# Patient Record
Sex: Male | Born: 1975 | Race: Black or African American | Hispanic: No | Marital: Single | Smoking: Current every day smoker
Health system: Southern US, Community
[De-identification: ages and names within clinical notes are randomized; demographics above are authoritative.]

## PROBLEM LIST (undated history)

## (undated) DIAGNOSIS — R011 Cardiac murmur, unspecified: Secondary | ICD-10-CM

---

## 1999-07-20 ENCOUNTER — Encounter: Payer: Self-pay | Admitting: Emergency Medicine

## 1999-07-20 ENCOUNTER — Emergency Department (HOSPITAL_COMMUNITY): Admission: EM | Admit: 1999-07-20 | Discharge: 1999-07-20 | Payer: Self-pay | Admitting: Emergency Medicine

## 2003-01-18 ENCOUNTER — Emergency Department (HOSPITAL_COMMUNITY): Admission: AD | Admit: 2003-01-18 | Discharge: 2003-01-18 | Payer: Self-pay | Admitting: Family Medicine

## 2003-12-23 ENCOUNTER — Emergency Department (HOSPITAL_COMMUNITY): Admission: EM | Admit: 2003-12-23 | Discharge: 2003-12-23 | Payer: Self-pay | Admitting: Family Medicine

## 2004-06-07 ENCOUNTER — Emergency Department (HOSPITAL_COMMUNITY): Admission: EM | Admit: 2004-06-07 | Discharge: 2004-06-07 | Payer: Self-pay | Admitting: Emergency Medicine

## 2005-09-19 ENCOUNTER — Emergency Department (HOSPITAL_COMMUNITY): Admission: EM | Admit: 2005-09-19 | Discharge: 2005-09-19 | Payer: Self-pay | Admitting: Family Medicine

## 2009-09-23 ENCOUNTER — Emergency Department (HOSPITAL_COMMUNITY): Admission: EM | Admit: 2009-09-23 | Discharge: 2009-09-23 | Payer: Self-pay | Admitting: Emergency Medicine

## 2010-11-28 ENCOUNTER — Inpatient Hospital Stay (INDEPENDENT_AMBULATORY_CARE_PROVIDER_SITE_OTHER)
Admission: RE | Admit: 2010-11-28 | Discharge: 2010-11-28 | Disposition: A | Discharge status: Home / Self Care | Payer: Self-pay | Source: Ambulatory Visit | Attending: Family Medicine | Admitting: Family Medicine

## 2010-11-28 DIAGNOSIS — J029 Acute pharyngitis, unspecified: Secondary | ICD-10-CM

## 2010-11-28 DIAGNOSIS — J039 Acute tonsillitis, unspecified: Secondary | ICD-10-CM

## 2010-11-28 LAB — POCT RAPID STREP A: Streptococcus, Group A Screen (Direct): NEGATIVE

## 2012-12-12 ENCOUNTER — Encounter (HOSPITAL_COMMUNITY): Payer: Self-pay | Admitting: Cardiology

## 2012-12-12 ENCOUNTER — Emergency Department (HOSPITAL_COMMUNITY)
Admission: EM | Admit: 2012-12-12 | Discharge: 2012-12-12 | Disposition: A | Discharge status: Home / Self Care | Payer: Self-pay | Attending: Emergency Medicine | Admitting: Emergency Medicine

## 2012-12-12 DIAGNOSIS — S81812A Laceration without foreign body, left lower leg, initial encounter: Secondary | ICD-10-CM

## 2012-12-12 DIAGNOSIS — Y990 Civilian activity done for income or pay: Secondary | ICD-10-CM | POA: Insufficient documentation

## 2012-12-12 DIAGNOSIS — Y9389 Activity, other specified: Secondary | ICD-10-CM | POA: Insufficient documentation

## 2012-12-12 DIAGNOSIS — S81009A Unspecified open wound, unspecified knee, initial encounter: Secondary | ICD-10-CM | POA: Insufficient documentation

## 2012-12-12 DIAGNOSIS — W278XXA Contact with other nonpowered hand tool, initial encounter: Secondary | ICD-10-CM | POA: Insufficient documentation

## 2012-12-12 DIAGNOSIS — F17200 Nicotine dependence, unspecified, uncomplicated: Secondary | ICD-10-CM | POA: Insufficient documentation

## 2012-12-12 DIAGNOSIS — Y929 Unspecified place or not applicable: Secondary | ICD-10-CM | POA: Insufficient documentation

## 2012-12-12 NOTE — ED Provider Notes (Signed)
CSN: [id number]     Arrival date & time 12/12/12  1353 History   First MD Initiated Contact with Patient 12/12/12 1405     Chief Complaint  Patient presents with  . Extremity Laceration   (Consider location/radiation/quality/duration/timing/severity/associated sxs/prior Treatment) HPI Comments: 37 year old male presents to the emergency department with a laceration to his left lower leg that occurred about an hour and a half prior to arrival. Patient states he was using a chainsaw when it slipped and cut him in the leg. Pain currently 3/10. Denies numbness or tingling. Denies any leg pain, pain is all at the laceration site. Last tetanus shot 3 years ago.  The history is provided by the patient.    History reviewed. No pertinent past medical history. History reviewed. No pertinent past surgical history. History reviewed. No pertinent family history. History  Substance Use Topics  . Smoking status: Current Every Day Smoker    Types: Cigarettes  . Smokeless tobacco: Not on file  . Alcohol Use: Yes    Review of Systems  Skin: Positive for wound.  All other systems reviewed and are negative.    Allergies  Review of patient's allergies indicates no known allergies.  Home Medications  No current outpatient prescriptions on file. BP 153/78  Pulse 90  Temp(Src) 98.3 F (36.8 C) (Oral)  Resp 18  Ht 5\' 7"  (1.702 m)  Wt 163 lb (73.936 kg)  BMI 25.52 kg/m2  SpO2 99% Physical Exam  Nursing note and vitals reviewed. Constitutional: He is oriented to person, place, and time. He appears well-developed and well-nourished. No distress.  HENT:  Head: Normocephalic and atraumatic.  Mouth/Throat: Oropharynx is clear and moist.  Eyes: Conjunctivae are normal.  Neck: Normal range of motion. Neck supple.  Cardiovascular: Normal rate, regular rhythm and normal heart sounds.   Capillary refill less than 3 seconds.  Pulmonary/Chest: Effort normal and breath sounds normal.    Musculoskeletal: Normal range of motion. He exhibits no edema.       Legs: Left lower leg nontender, full flexion and extension of ankle and knee.  Neurological: He is alert and oriented to person, place, and time.  Sensation intact.  Skin: Skin is warm and dry. He is not diaphoretic.  Psychiatric: He has a normal mood and affect. His behavior is normal.    ED Course  Procedures (including critical care time) LACERATION REPAIR Performed by: Johnnette Gourd Authorized by: Johnnette Gourd Consent: Verbal consent obtained. Risks and benefits: risks, benefits and alternatives were discussed Consent given by: patient Patient identity confirmed: provided demographic data Prepped and Draped in normal sterile fashion Wound explored  Laceration Location: left lower leg  Laceration Length: 6 cm  No Foreign Bodies seen or palpated  Anesthesia: local infiltration  Local anesthetic: lidocaine 2% with epinephrine  Anesthetic total: 6 ml  Irrigation method: syringe Amount of cleaning: standard  Skin closure: 3-0 prolene  Number of sutures: 10  Technique: simple interrupted  Patient tolerance: Patient tolerated the procedure well with no immediate complications.  Labs Review Labs Reviewed - No data to display Imaging Review No results found.  MDM   1. Leg laceration, left, initial encounter    Patient with left leg laceration. Neurovascularly intact. Full range of motion of left lower extremity. Wound cleaned, 10 sutures placed. Return precautions discussed. Up-to-date on tetanus shot. Patient states understanding of plan and is agreeable.    Trevor Mace, PA-C 12/12/12 1510

## 2012-12-12 NOTE — ED Notes (Signed)
Pt reports that he was at work using a chainsaw and cut his left lower leg. Pt bleeding controlled. Pt reports laceration is about 4 inches. Unknown about last tetnus.

## 2012-12-13 NOTE — ED Provider Notes (Signed)
Medical screening examination/treatment/procedure(s) were performed by non-physician practitioner and as supervising physician I was immediately available for consultation/collaboration.  Melecio Cueto L Jamarquis Crull, MD 12/13/12 1000 

## 2012-12-28 ENCOUNTER — Encounter (HOSPITAL_COMMUNITY): Payer: Self-pay | Admitting: Emergency Medicine

## 2012-12-28 ENCOUNTER — Emergency Department (HOSPITAL_COMMUNITY)
Admission: EM | Admit: 2012-12-28 | Discharge: 2012-12-28 | Disposition: A | Discharge status: Home / Self Care | Payer: Self-pay | Attending: Emergency Medicine | Admitting: Emergency Medicine

## 2012-12-28 DIAGNOSIS — Z4802 Encounter for removal of sutures: Secondary | ICD-10-CM | POA: Insufficient documentation

## 2012-12-28 DIAGNOSIS — F17200 Nicotine dependence, unspecified, uncomplicated: Secondary | ICD-10-CM | POA: Insufficient documentation

## 2012-12-28 NOTE — ED Notes (Signed)
Pt here for suture removal on left lateral calf.

## 2012-12-28 NOTE — ED Provider Notes (Signed)
CSN: [id number]     Arrival date & time 12/28/12  0917 History  This chart was scribed for non-physician practitioner, Francee Piccolo PA-C working with Joya Gaskins, MD by Joaquin Music, ED Scribe. This patient was seen in room TR06C/TR06C and the patient's care was started at 9:26 AM   Chief Complaint  Patient presents with  . Suture / Staple Removal   HPI Comments: Patient is a 37 yo M presenting to the ED for suture removal. Patient was seen on 12/12/12 for a laceration to his left lateral calf from a chainsaw. Patient denies any pain or other concerning associated symptoms currently. Denies numbness or tingling in the extremity. Denies any fevers, chills, drainage from site, new injury. Last tetanus shot 3 years ago.   History reviewed. No pertinent past medical history. History reviewed. No pertinent past surgical history. History reviewed. No pertinent family history.  History  Substance Use Topics  . Smoking status: Current Every Day Smoker    Types: Cigarettes  . Smokeless tobacco: Not on file  . Alcohol Use: Yes    Review of Systems  Constitutional: Negative for fever and chills.  Musculoskeletal: Negative for myalgias, joint swelling and arthralgias.  Skin: Negative for color change, pallor and rash.    Allergies  Review of patient's allergies indicates no known allergies.  Home Medications   Current Outpatient Rx  Name  Route  Sig  Dispense  Refill  . Phenylephrine-APAP-Guaifenesin (MUCINEX FAST-MAX COLD & SINUS) 10-650-400 MG/20ML LIQD   Oral   Take by mouth as needed (cold).          Triage Vitals: BP 118/76  Pulse 89  Temp(Src) 97.3 F (36.3 C) (Oral)  Resp 20  SpO2 100%  Physical Exam  Constitutional: He is oriented to person, place, and time. He appears well-developed and well-nourished. No distress.  HENT:  Head: Normocephalic and atraumatic.  Right Ear: External ear normal.  Left Ear: External ear normal.  Nose: Nose  normal.  Eyes: Conjunctivae are normal.  Neck: Neck supple.  Cardiovascular: Intact distal pulses.   Pulmonary/Chest: Effort normal and breath sounds normal.  Musculoskeletal: Normal range of motion. He exhibits no edema and no tenderness.  Neurological: He is alert and oriented to person, place, and time.  Skin: Skin is warm and dry. He is not diaphoretic. No erythema.  Well healed incision on left lateral calf. No drainage. No surrounding erythema. Non-tender to palpation. No warmth.   Psychiatric: He has a normal mood and affect.    ED Course  Procedures  DIAGNOSTIC STUDIES: Oxygen Saturation is 100% on RA, normal by my interpretation.    COORDINATION OF CARE: 9:28 AM-Discussed treatment plan which includes suture removal. Pt agreed to plan.   Labs Review Labs Reviewed - No data to display Imaging Review No results found.  MDM   1. Visit for suture removal     Afebrile, NAD, non-toxic appearing, AAOx4. Neurovascularly intact. Well healed scar. No concern for infection. Pt to ER for staple/suture removal and wound check as above. Procedure tolerated well. Vitals normal, no signs of infection. Scar minimization & return precautions given at dc. Return precautions discussed. Patient is agreeable to plan. Patient is stable at time of discharge      I personally performed the services described in this documentation, which was scribed in my presence. The recorded information has been reviewed and is accurate.     Jeannetta Ellis, PA-C 12/28/12 1643

## 2012-12-30 NOTE — ED Provider Notes (Signed)
Medical screening examination/treatment/procedure(s) were performed by non-physician practitioner and as supervising physician I was immediately available for consultation/collaboration.   Joya Gaskins, MD 12/30/12 2249

## 2014-02-28 ENCOUNTER — Encounter (HOSPITAL_COMMUNITY): Payer: Self-pay | Admitting: Emergency Medicine

## 2014-02-28 ENCOUNTER — Emergency Department (HOSPITAL_COMMUNITY): Payer: Self-pay

## 2014-02-28 ENCOUNTER — Emergency Department (HOSPITAL_COMMUNITY)
Admission: EM | Admit: 2014-02-28 | Discharge: 2014-02-28 | Disposition: A | Discharge status: Home / Self Care | Payer: Self-pay | Attending: Emergency Medicine | Admitting: Emergency Medicine

## 2014-02-28 ENCOUNTER — Other Ambulatory Visit: Payer: Self-pay

## 2014-02-28 DIAGNOSIS — R0789 Other chest pain: Secondary | ICD-10-CM | POA: Insufficient documentation

## 2014-02-28 DIAGNOSIS — R079 Chest pain, unspecified: Secondary | ICD-10-CM

## 2014-02-28 DIAGNOSIS — Z720 Tobacco use: Secondary | ICD-10-CM | POA: Insufficient documentation

## 2014-02-28 LAB — CBC
HCT: 45 % (ref 39.0–52.0)
HEMOGLOBIN: 15.6 g/dL (ref 13.0–17.0)
MCH: 33.5 pg (ref 26.0–34.0)
MCHC: 34.7 g/dL (ref 30.0–36.0)
MCV: 96.8 fL (ref 78.0–100.0)
PLATELETS: 243 10*3/uL (ref 150–400)
RBC: 4.65 MIL/uL (ref 4.22–5.81)
RDW: 12.2 % (ref 11.5–15.5)
WBC: 13.2 10*3/uL — ABNORMAL HIGH (ref 4.0–10.5)

## 2014-02-28 LAB — BASIC METABOLIC PANEL
ANION GAP: 16 — AB (ref 5–15)
BUN: 11 mg/dL (ref 6–23)
CO2: 22 mEq/L (ref 19–32)
Calcium: 9.6 mg/dL (ref 8.4–10.5)
Chloride: 101 mEq/L (ref 96–112)
Creatinine, Ser: 0.95 mg/dL (ref 0.50–1.35)
GFR calc Af Amer: >90 mL/min (ref >90)
GLUCOSE: 113 mg/dL — AB (ref 70–99)
Potassium: 4.5 mEq/L (ref 3.7–5.3)
SODIUM: 139 meq/L (ref 137–147)

## 2014-02-28 LAB — I-STAT TROPONIN, ED
TROPONIN I, POC: 0 ng/mL (ref 0.00–0.08)
TROPONIN I, POC: 0 ng/mL (ref 0.00–0.08)

## 2014-02-28 NOTE — ED Notes (Signed)
Report to becky, rn.  Pt care transferred 

## 2014-02-28 NOTE — ED Notes (Signed)
Per Dr Rhunette CroftNanavati, order 2nd trop to be drawn 3 hours after 1st - 1900.

## 2014-02-28 NOTE — ED Provider Notes (Signed)
CSN: [id number]     Arrival date & time 02/28/14  1553 History   First MD Initiated Contact with Patient 02/28/14 1622     Chief Complaint  Patient presents with  . Chest Pain     (Consider location/radiation/quality/duration/timing/severity/associated sxs/prior Treatment) HPI Comments: Pt comes in with cc of chest pain. Pt has no medical hx. He is a heavy smoker, and his father dies of a MI in his 5440s. Pt reports that he has been having : sided chest pain, intermittent, for the last several weeks. For the last few days though, the pain is more constant. Chest pain is sharp, intermittent, focal, non radiating and lasts just for few seconds at a time. Pt has no specific aggravating or relieving factors. There is no associated nausea, dib, diophoresis. Pain is not exertional.  Patient is a 38 y.o. male presenting with chest pain. The history is provided by the patient.  Chest Pain Associated symptoms: no abdominal pain, no cough and no shortness of breath     History reviewed. No pertinent past medical history. History reviewed. No pertinent past surgical history. No family history on file. History  Substance Use Topics  . Smoking status: Current Every Day Smoker -- 1.00 packs/day    Types: Cigarettes  . Smokeless tobacco: Not on file  . Alcohol Use: Yes     Comment: heavy etoh every weekend x 2 years    Review of Systems  Constitutional: Negative for activity change and appetite change.  Respiratory: Negative for cough and shortness of breath.   Cardiovascular: Positive for chest pain.  Gastrointestinal: Negative for abdominal pain.  Genitourinary: Negative for dysuria.      Allergies  Review of patient's allergies indicates no known allergies.  Home Medications   Prior to Admission medications   Not on File   BP 117/90 mmHg  Pulse 60  Temp(Src) 98 F (36.7 C) (Oral)  Resp 17  Ht 5\' 7"  (1.702 m)  Wt 170 lb (77.111 kg)  BMI 26.62 kg/m2  SpO2 99% Physical Exam   Constitutional: He is oriented to person, place, and time. He appears well-developed.  HENT:  Head: Normocephalic and atraumatic.  Eyes: Conjunctivae and EOM are normal. Pupils are equal, round, and reactive to light.  Neck: Normal range of motion. Neck supple.  Cardiovascular: Normal rate and regular rhythm.   Pulmonary/Chest: Effort normal and breath sounds normal.  Abdominal: Soft. Bowel sounds are normal. He exhibits no distension. There is no tenderness. There is no rebound and no guarding.  Neurological: He is alert and oriented to person, place, and time.  Skin: Skin is warm.  Vitals reviewed.   ED Course  Procedures (including critical care time) Labs Review Labs Reviewed  CBC - Abnormal; Notable for the following:    WBC 13.2 (*)    All other components within normal limits  BASIC METABOLIC PANEL - Abnormal; Notable for the following:    Glucose, Bld 113 (*)    Anion gap 16 (*)    All other components within normal limits  I-STAT TROPOININ, ED    Imaging Review Dg Chest 2 View  02/28/2014   CLINICAL DATA:  Chest pain.  EXAM: CHEST  2 VIEW  COMPARISON:  None.  FINDINGS: The heart size and mediastinal contours are within normal limits. Both lungs are clear. No pneumothorax or pleural effusion is noted. The visualized skeletal structures are unremarkable.  IMPRESSION: No acute cardiopulmonary abnormality seen.   Electronically Signed   By: Fayrene FearingJames  Green M.D.   On: 02/28/2014 17:07     EKG Interpretation None      MDM   Final diagnoses:  Chest pain    Differential diagnosis includes: ACS syndrome CHF exacerbation Valvular disorder Myocarditis Pericarditis Pericardial effusion Pneumonia Pleural effusion Pulmonary edema PE Anemia Musculoskeletal pain  Pt comes in with cc of chest pain. Chest pain is atypical. HEART score is 1 - for the risk factors. Pt will get serial troponin.  Smoking cessation instruction/counseling given:  counseled patient on the  dangers of tobacco use, advised patient to stop smoking, and reviewed strategies to maximize success. Discussion for 3- 5 min.  7:39 PM Normal delta trops, will d.c.  Derwood KaplanAnkit Calton Harshfield, MD 02/28/14 [phone]

## 2014-02-28 NOTE — Discharge Instructions (Signed)
We saw you in the ER for the chest pain/shortness of breath. All of our cardiac workup is normal, including labs, EKG and chest X-RAY are normal. We are not sure what is causing your discomfort, but we feel comfortable sending you home at this time. The workup in the ER is not complete, and you should follow up with your primary care doctor for further evaluation.  PLEASE TAKE BABY ASPIRIN, OVER THE COUNTER DAILY. REFRAIN FROM SMOKING, SEE THE DOCTORS AT [facility] - OR USE THE RESOURCES PROVIDED.  RESOURCE GUIDE  Chronic Pain Problems: Contact [facility]  [phone] Patients need to be referred by their primary care doctor.  Insufficient Money for Medicine: Contact [employer]:  call "211."   No Primary Care Doctor: - Call Health Connect  [phone] - can help you locate a primary care doctor that  accepts your insurance, provides certain services, etc. - Physician Referral Service- [phone]  Agencies that provide inexpensive medical care: - [facility]  [phone] - [facility]  [phone] - [facility]  [phone] - Women's Clinic  [phone] - Planned Parenthood  [phone]- [facility]  [phone]  Medicaid-accepting [facility] Providers: - [facility]- [address], [address]  [phone], Mon-Fri 9am-7pm, Sat 9am-1pm - [facility]- [address], [address] [place]  [phone] - [facility]- [address], [address] [place]  [phone] [facility]- [address] [place] [address]  [phone] - Renaye RakersVeita Bland- [address], [address], [phone]  Only accepts [place] patients after they have their name  applied to their card  Self Pay (no insurance) in [place]: - Sickle Cell Patients: Dr Willey BladeEric Dean, [facility]  [address], [phone] - [facility]- [address]  [phone]        [facility]ge Gainer-     [facility]- [address], [address]       -     [facility]- see information above (Speak to CitigroupPam H if you do not have insurance)       -  [facility] [place]- [address],  [phone]       -  [facility]- [address] [place] [address], [phone]       -  Dr Julio Sickssei-Bonsu-  [address], [address], [place], [phone]       -  [facility] - [address], [phone]       -  [facility] [place]- [address] [place] [address], [phone], also [address], [phone]       -    [facility]- [address], [phone], 1st & 3rd Saturday        every month, 10am-1pm  [facility] [address] [place], [place] [address] [phone]  [facility] [address] [place], [place] [address] [phone]  1) Find a Doctor and Pay Out of Pocket Although you won't have to find out who is covered by your insurance plan, it is a good idea to ask around and get recommendations. You will then need to call the office and see if the doctor you have chosen will accept you as a new patient and what types of options they offer for patients who are  self-pay. Some doctors offer discounts or will set up payment plans for their patients who do not have insurance, but you will need to ask so you aren't surprised when you get to your appointment.  2) Contact Your Local Health Department Not all health departments have doctors that can see patients for sick visits, but many do, so it is worth a call to see if yours does. If you don't know where your local health department is, you can check in your phone book. The CDC also has a tool to help you locate your state's health department, and many state websites also have listings of all of their local health departments.  3) Find a Walk-in Clinic If your illness is not likely to be very severe or complicated, you may want to try a walk in  clinic. These are popping up all over the country in pharmacies, drugstores, and shopping centers. They're usually staffed by nurse practitioners or physician assistants that have been trained to treat common illnesses and complaints. They're usually fairly quick and inexpensive. However, if you have serious medical issues or chronic medical problems, these are probably not your best option  STD Testing - [facility], STD Clinic, [address], [place], phone [phone] or [phone].  Monday - Friday, call for an appointment. [facility]- [place] [facility] of [facility] [place], STD Clinic, [place], [place], phone [phone] or [phone].  Monday - Friday, call for an appointment.  Abuse/Neglect: [facility]- [place] Child Abuse Hotline [phone] [facility]- [place] Child Abuse Hotline [phone] (After Hours)  Emergency Shelter:  [facility] [phone]  Maternity Homes: - [facility] [phone] - [facility] [phone]  MRSA Hotline #:   (832) 198-8587(364)799-8617  Dental Assistance If unable to pay or uninsured, contact:  [facility] to become qualified for the adult dental clinic.  Patients with Medicaid: [facility] [phone], [phone] [address], [phone]  If unable to pay, or uninsured, contact [facility] [phone] in [place], [phone] in [facility]) to become qualified for the adult dental clinic  [facility] [address] [place], [place] [address] [phone] [email or web address]  Other [facility] Community Dental Services: - [facility]- [address], [place], [place], [phone], [phone], Ext. [phone], 2nd and 4th Thursday of the month at 6:30am.  10 clients each day by appointment, can sometimes see walk-in patients if someone does not show for an  appointment. [facility]- [address], [place], [place], [phone], [phone] - [facility]- [address], [place], [place], [phone], [phone] - [facility]- [phone] [facility]- [place]- [phone] [facility]- [place] [facility]- [phone]

## 2014-02-28 NOTE — ED Notes (Signed)
C/o intermittent sharp L sided chest pain x 2-3 days.  States pain becoming more constant since yesterday.  Denies sob, nausea, and vomiting.  Also reports dizziness and feeling anxious.

## 2015-03-13 ENCOUNTER — Encounter (HOSPITAL_COMMUNITY): Payer: Self-pay | Admitting: Emergency Medicine

## 2015-03-13 ENCOUNTER — Emergency Department (HOSPITAL_COMMUNITY)
Admission: EM | Admit: 2015-03-13 | Discharge: 2015-03-13 | Disposition: A | Discharge status: Home / Self Care | Payer: Self-pay | Attending: Emergency Medicine | Admitting: Emergency Medicine

## 2015-03-13 DIAGNOSIS — F17210 Nicotine dependence, cigarettes, uncomplicated: Secondary | ICD-10-CM | POA: Insufficient documentation

## 2015-03-13 DIAGNOSIS — K029 Dental caries, unspecified: Secondary | ICD-10-CM | POA: Insufficient documentation

## 2015-03-13 MED ORDER — TRAMADOL HCL 50 MG PO TABS: 50.0000 mg | ORAL_TABLET | Freq: Four times a day (QID) | ORAL | Status: DC | PRN

## 2015-03-13 MED ORDER — AMOXICILLIN 500 MG PO CAPS: 500.0000 mg | ORAL_CAPSULE | Freq: Three times a day (TID) | ORAL | Status: DC

## 2015-03-13 NOTE — ED Notes (Signed)
Pt is in stable condition upon d/c and ambulates from ED. 

## 2015-03-13 NOTE — Discharge Instructions (Signed)
For pain control you may take:  800mg  of ibuprofen (that is usually 4 over the counter pills)  3 times a day (take with food) and acetaminophen 975mg  (this is 3 over the counter pills) four times a day. Do not drink alcohol or combine with other medications that have acetaminophen as an ingredient (Read the labels!).  For breakthrough pain you may take Tramadol. Do not drink alcohol drive or operate heavy machinery when taking Tramadol.  Return to the emergency room for fever, change in vision, redness to the face that rapidly spreads towards the eye, nausea or vomiting, difficulty swallowing or shortness of breath.   Apply warm compresses to jaw throughout the day.   Take your antibiotics as directed and to the end of the course.   Followup with a dentist is very important for ongoing evaluation and management of recurrent dental pain. Return to emergency department for emergent changing or worsening symptoms."    Dental Caries Dental caries is tooth decay. This decay can cause a hole in teeth (cavity) that can get bigger and deeper over time. HOME CARE  Brush and floss your teeth. Do this at least two times a day.  Use a fluoride toothpaste.  Use a mouth rinse if told by your dentist or doctor.  Eat less sugary and starchy foods. Drink less sugary drinks.  Avoid snacking often on sugary and starchy foods. Avoid sipping often on sugary drinks.  Keep regular checkups and cleanings with your dentist.  Use fluoride supplements if told by your dentist or doctor.  Allow fluoride to be applied to teeth if told by your dentist or doctor.   This information is not intended to replace advice given to you by your health care provider. Make sure you discuss any questions you have with your health care provider.   Document Released: 12/27/2007 Document Revised: 04/09/2014 Document Reviewed: 03/21/2012 Elsevier Interactive Patient Education 2016 [employer]  Low-cost dental  clinic: Gregory Christian**Gregory  Christian  at [phone] Christian**   You may also call [phone]  Dental Assistance If the dentist on-call cannot see you, please use the resources below:   Patients with Medicaid: [facility] [phone], [phone] [address], [phone]  If unable to pay, or uninsured, contact [facility] [phone]) or [facility] [phone] in [place], [phone] in [facility] [place]) to become qualified for the adult dental clinic  Other Low-Cost Community Dental Services: [facility]- [address], [place], [place], [address]    [phone], Ext. [phone]    2nd and 4th Thursday of the month at 6:30am    10 clients each day by appointment, can sometimes see walk-in     patients if someone does not show for an appointment [facility]- [address], [place], [place], [id number]    [phone] [facility]- [address], [place], [place], [address]    [phone]  [facility]- [phone] [facility]- [phone] [facility]- [phone]

## 2015-03-13 NOTE — ED Provider Notes (Signed)
CSN: [id number]     Arrival date & time 03/13/15  4098 History  By signing my name below, I, Elon Spanner, attest that this documentation has been prepared under the direction and in the presence of [employer], PA-C. Electronically Signed: Elon Spanner ED Scribe. 03/13/2015. 10:38 AM.    Chief Complaint  Patient presents with  . Dental Pain   HPI HPI Comments: Gregory Christian is a 39 y.o. male who presents to the Emergency Department complaining of constant, 12/10 left upper dental pain onset 3 days ago; unrelieved by Percocet  (2 days ago), tramadol, Orajel, and ibuprofen.  The patient reports he broke the affected tooth 1 year ago and has had 1-2 several day of episodes of pain since with complete resolution between episodes.  The pain is relieved by exposure to cold water.  The patient is not followed and has not been seen by a dentist. Patient rates his pain at 10 out of 10, no exacerbating or alleviating factors identified.   History reviewed. No pertinent past medical history. History reviewed. No pertinent past surgical history. No family history on file. Social History  Substance Use Topics  . Smoking status: Current Every Day Smoker -- 1.00 packs/day    Types: Cigarettes  . Smokeless tobacco: None  . Alcohol Use: Yes     Comment: heavy etoh every weekend x 2 years    Review of Systems .A complete 10 system review of systems was obtained and all systems are negative except as noted in the HPI and PMH.   Allergies  Review of patient's allergies indicates no known allergies.  Home Medications   Prior to Admission medications   Not on File   BP 110/71 mmHg  Pulse 62  Temp(Src) 98 F (36.7 C) (Oral)  Resp 20  SpO2 99% Physical Exam  Constitutional: He is oriented to person, place, and time. He appears well-developed and well-nourished. No distress.  HENT:  Head: Normocephalic and atraumatic.  Mouth/Throat: Oropharynx is clear and moist.  Generally poor  dentition, no gingival swelling, erythema or tenderness to palpation. Patient is handling their secretions. There is no tenderness to palpation or firmness underneath tongue bilaterally. No trismus.    Eyes: Conjunctivae and EOM are normal. Pupils are equal, round, and reactive to light.  Neck: Normal range of motion. Neck supple. No tracheal deviation present.     Cardiovascular: Normal rate, regular rhythm and intact distal pulses.   Pulmonary/Chest: Effort normal and breath sounds normal. No stridor. No respiratory distress. He has no wheezes. He has no rales. He exhibits no tenderness.  Abdominal: Soft. Bowel sounds are normal. He exhibits no distension and no mass. There is no tenderness. There is no rebound and no guarding.  Musculoskeletal: Normal range of motion. He exhibits no edema or tenderness.  Lymphadenopathy:    He has no cervical adenopathy.  Neurological: He is alert and oriented to person, place, and time.  Skin: Skin is warm and dry.  Psychiatric: He has a normal mood and affect. His behavior is normal.  Nursing note and vitals reviewed.   ED Course  .Nerve Block Date/Time: 03/13/2015 3:08 PM Performed by: Wynetta Emery Authorized by: Wynetta Emery Consent: Verbal consent obtained. Consent given by: patient Required items: required blood products, implants, devices, and special equipment available Patient identity confirmed: verbally with patient Indications: pain relief Body area: face/mouth Nerve: posterior superior alveolar Laterality: left Patient sedated: no Preparation: Patient was prepped and draped in the usual sterile fashion. Patient position:  sitting Needle gauge: 27 G Location technique: anatomical landmarks Local anesthetic: bupivacaine 0.5% with epinephrine Anesthetic total: 1.8 ml Outcome: pain improved Patient tolerance: Patient tolerated the procedure well with no immediate complications    .Nerve Block Date/Time: 03/13/2015 3:08  PM Performed by: Wynetta EmeryPISCIOTTA, Nettie Cromwell Authorized by: Wynetta EmeryPISCIOTTA, Evelin Cake Consent: Verbal consent obtained. Consent given by: patient Required items: required blood products, implants, devices, and special equipment available Patient identity confirmed: verbally with patient Indications: pain relief Body area: face/mouth Nerve: inferior alveolar Laterality: left Patient sedated: no Preparation: Patient was prepped and draped in the usual sterile fashion. Patient position: sitting Needle gauge: 27 G Location technique: anatomical landmarks Local anesthetic: bupivacaine 0.5% with epinephrine Anesthetic total: 1.8 ml Outcome: pain improved Patient tolerance: Patient tolerated the procedure well with no immediate complications   DIAGNOSTIC STUDIES: Oxygen Saturation is 99% on RA, normal by my interpretation.    COORDINATION OF CARE:  10:37 AM Will perform dental block and antibiotic.  Patient was offered pain medication and specifically requested tramadol.  Patient advised to f/u with dentist.  Patient acknowledges and agrees with plan.    Labs Review Labs Reviewed - No data to display  Imaging Review No results found. I have personally reviewed and evaluated these images and lab results as part of my medical decision-making.   EKG Interpretation None      MDM   Final diagnoses:  Pain due to dental caries    Filed Vitals:   03/13/15 0920  BP: 110/71  Pulse: 62  Temp: 98 F (36.7 C)  TempSrc: Oral  Resp: 20  SpO2: 99%    Gregory Christian is 39 y.o. male presenting with dental pain associated with dental caries but no signs or symptoms of dental abscess. Patient afebrile, non toxic appearing and swallowing secretions well. I gave patient referral to dentist and stressed the importance of dental follow up for definitive management of dental issues. Patient voices understanding and is agreeable to plan.  Evaluation does not show pathology that would require ongoing  emergent intervention or inpatient treatment. Pt is hemodynamically stable and mentating appropriately. Discussed findings and plan with patient/guardian, who agrees with care plan. All questions answered. Return precautions discussed and outpatient follow up given.   Discharge Medication List as of 03/13/2015 10:44 AM    START taking these medications   Details  amoxicillin (AMOXIL) 500 MG capsule Take 1 capsule (500 mg total) by mouth 3 (three) times daily., Starting 03/13/2015, Until Discontinued, Print    traMADol (ULTRAM) 50 MG tablet Take 1 tablet (50 mg total) by mouth every 6 (six) hours as needed., Starting 03/13/2015, Until Discontinued, Print        I personally performed the services described in this documentation, which was scribed in my presence. The recorded information has been reviewed and is accurate.    Wynetta Emeryicole Marri Mcneff, PA-C 03/13/15 1510  Cathren LaineKevin Steinl, MD 03/13/15 [id number]

## 2015-03-13 NOTE — ED Notes (Signed)
Pt c/o left upper and lower toothache onset Thursday.

## 2015-11-17 ENCOUNTER — Emergency Department (HOSPITAL_COMMUNITY): Payer: Self-pay

## 2015-11-17 ENCOUNTER — Emergency Department (HOSPITAL_COMMUNITY)
Admission: EM | Admit: 2015-11-17 | Discharge: 2015-11-17 | Disposition: A | Discharge status: Home / Self Care | Payer: Self-pay | Attending: Emergency Medicine | Admitting: Emergency Medicine

## 2015-11-17 ENCOUNTER — Encounter (HOSPITAL_COMMUNITY): Payer: Self-pay | Admitting: Emergency Medicine

## 2015-11-17 DIAGNOSIS — S161XXA Strain of muscle, fascia and tendon at neck level, initial encounter: Secondary | ICD-10-CM | POA: Insufficient documentation

## 2015-11-17 DIAGNOSIS — S20219A Contusion of unspecified front wall of thorax, initial encounter: Secondary | ICD-10-CM | POA: Insufficient documentation

## 2015-11-17 DIAGNOSIS — Y939 Activity, unspecified: Secondary | ICD-10-CM | POA: Insufficient documentation

## 2015-11-17 DIAGNOSIS — Y999 Unspecified external cause status: Secondary | ICD-10-CM | POA: Insufficient documentation

## 2015-11-17 DIAGNOSIS — Y92410 Unspecified street and highway as the place of occurrence of the external cause: Secondary | ICD-10-CM | POA: Insufficient documentation

## 2015-11-17 DIAGNOSIS — F17210 Nicotine dependence, cigarettes, uncomplicated: Secondary | ICD-10-CM | POA: Insufficient documentation

## 2015-11-17 MED ORDER — NAPROXEN 500 MG PO TABS: 500.0000 mg | ORAL_TABLET | Freq: Two times a day (BID) | ORAL | 0 refills | Status: DC

## 2015-11-17 MED ORDER — ORPHENADRINE CITRATE ER 100 MG PO TB12
100.0000 mg | ORAL_TABLET | Freq: Two times a day (BID) | ORAL | 0 refills | Status: DC | PRN
Start: 2015-11-17 — End: 2019-04-29

## 2015-11-17 MED ORDER — NAPROXEN 250 MG PO TABS
500.0000 mg | ORAL_TABLET | Freq: Once | ORAL | Status: AC
  Administered 2015-11-17: 500 mg via ORAL
  Filled 2015-11-17: qty 2
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
## 2015-11-17 NOTE — ED Notes (Signed)
Pt stable, ambulatory, states understanding of discharge instructions 

## 2015-11-17 NOTE — ED Notes (Signed)
Pt ambulated to waiting lobby, family left at bedside assured that he was coming back
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
## 2015-11-17 NOTE — ED Notes (Signed)
Respiratory to bring spirometer
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
## 2018-09-21 ENCOUNTER — Emergency Department (HOSPITAL_COMMUNITY)
Admission: EM | Admit: 2018-09-21 | Discharge: 2018-09-21 | Disposition: A | Discharge status: Home / Self Care | Payer: Self-pay | Attending: Emergency Medicine | Admitting: Emergency Medicine

## 2018-09-21 ENCOUNTER — Other Ambulatory Visit: Payer: Self-pay

## 2018-09-21 ENCOUNTER — Encounter (HOSPITAL_COMMUNITY): Payer: Self-pay | Admitting: Emergency Medicine

## 2018-09-21 DIAGNOSIS — R112 Nausea with vomiting, unspecified: Secondary | ICD-10-CM | POA: Insufficient documentation

## 2018-09-21 DIAGNOSIS — F17210 Nicotine dependence, cigarettes, uncomplicated: Secondary | ICD-10-CM | POA: Insufficient documentation

## 2018-09-21 DIAGNOSIS — R1013 Epigastric pain: Secondary | ICD-10-CM | POA: Insufficient documentation

## 2018-09-21 LAB — LIPASE, BLOOD: Lipase: 27 U/L (ref 11–51)

## 2018-09-21 LAB — COMPREHENSIVE METABOLIC PANEL
ALT: 16 U/L (ref 0–44)
AST: 23 U/L (ref 15–41)
Albumin: 4.6 g/dL (ref 3.5–5.0)
Alkaline Phosphatase: 67 U/L (ref 38–126)
Anion gap: 14 (ref 5–15)
BUN: 13 mg/dL (ref 6–20)
CO2: 21 mmol/L — ABNORMAL LOW (ref 22–32)
Calcium: 9.4 mg/dL (ref 8.9–10.3)
Chloride: 105 mmol/L (ref 98–111)
Creatinine, Ser: 0.82 mg/dL (ref 0.61–1.24)
GFR calc Af Amer: >60 mL/min (ref >60)
GFR calc non Af Amer: >60 mL/min (ref >60)
Glucose, Bld: 90 mg/dL (ref 70–99)
Potassium: 3.7 mmol/L (ref 3.5–5.1)
Sodium: 140 mmol/L (ref 135–145)
Total Bilirubin: 1 mg/dL (ref 0.3–1.2)
Total Protein: 8 g/dL (ref 6.5–8.1)

## 2018-09-21 LAB — CBC
HCT: 47.1 % (ref 39.0–52.0)
Hemoglobin: 16 g/dL (ref 13.0–17.0)
MCH: 33.8 pg (ref 26.0–34.0)
MCHC: 34 g/dL (ref 30.0–36.0)
MCV: 99.6 fL (ref 80.0–100.0)
Platelets: 293 10*3/uL (ref 150–400)
RBC: 4.73 MIL/uL (ref 4.22–5.81)
RDW: 12 % (ref 11.5–15.5)
WBC: 16.4 10*3/uL — ABNORMAL HIGH (ref 4.0–10.5)
nRBC: 0 % (ref 0.0–0.2)

## 2018-09-21 LAB — CBG MONITORING, ED: Glucose-Capillary: 116 mg/dL — ABNORMAL HIGH (ref 70–99)

## 2018-09-21 MED ORDER — FAMOTIDINE IN NACL 20-0.9 MG/50ML-% IV SOLN
20.0000 mg | Freq: Once | INTRAVENOUS | Status: AC
  Administered 2018-09-21: 20 mg via INTRAVENOUS
  Filled 2018-09-21: qty 50

## 2018-09-21 MED ORDER — SODIUM CHLORIDE 0.9 % IV BOLUS
1000.0000 mL | Freq: Once | INTRAVENOUS | Status: AC
  Administered 2018-09-21: 1000 mL via INTRAVENOUS

## 2018-09-21 MED ORDER — FAMOTIDINE 20 MG PO TABS: 20.0000 mg | ORAL_TABLET | Freq: Every day | ORAL | 0 refills | Status: AC
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
## 2018-09-21 NOTE — ED Provider Notes (Signed)
[facility] DEPT Provider Note   CSN: [id number] Arrival date & time: 09/21/18  1603     History   Chief Complaint Chief Complaint  Patient presents with  . Abdominal Pain  . Emesis    HPI Gregory Christian is a 43 y.o. male.  He has no significant medical history.  He is complaining acute onset of diffuse abdominal pain and 4 episodes of vomiting since 1 AM this morning after drinking alcohol.  He said his last vomit was at 10 AM this morning.  There is no blood in the vomitus.  No fevers or chills no diarrhea.  No sick contacts or recent travel.  He is tried nothing for this.     The history is provided by the patient.  Abdominal Pain Pain location:  Generalized Pain quality: burning and stabbing   Pain radiates to:  Does not radiate Pain severity:  Moderate Onset quality:  Sudden Timing:  Constant Progression:  Unchanged Chronicity:  New Context: alcohol use and retching   Context: not trauma   Relieved by:  Nothing Worsened by:  Vomiting Ineffective treatments:  None tried Associated symptoms: nausea and vomiting   Associated symptoms: no chest pain, no constipation, no cough, no diarrhea, no dysuria, no fever, no hematemesis, no hematochezia, no hematuria, no melena, no shortness of breath and no sore throat   Emesis Associated symptoms: abdominal pain   Associated symptoms: no cough, no diarrhea, no fever, no headaches and no sore throat     History reviewed. No pertinent past medical history.  There are no active problems to display for this patient.   History reviewed. No pertinent surgical history.      Home Medications    Prior to Admission medications   Medication Sig Start Date End Date Taking? Authorizing Provider  amoxicillin (AMOXIL) 500 MG capsule Take 1 capsule (500 mg total) by mouth 3 (three) times daily. 03/13/15   Pisciotta, Elmyra Ricks, PA-C  naproxen (NAPROSYN) 500 MG tablet Take 1 tablet (500 mg total) by mouth 2  (two) times daily. 11/17/15   Law, Bea Graff, PA-C  orphenadrine (NORFLEX) 100 MG tablet Take 1 tablet (100 mg total) by mouth 2 (two) times daily as needed for muscle spasms. 11/17/15   Law, Bea Graff, PA-C  traMADol (ULTRAM) 50 MG tablet Take 1 tablet (50 mg total) by mouth every 6 (six) hours as needed. 03/13/15   Pisciotta, Charna Elizabeth    Family History No family history on file.  Social History Social History   Tobacco Use  . Smoking status: Current Every Day Smoker    Packs/day: 1.00    Types: Cigarettes  . Smokeless tobacco: Never Used  Substance Use Topics  . Alcohol use: Yes    Comment: heavy etoh every weekend x 2 years  . Drug use: No     Allergies   Patient has no known allergies.   Review of Systems Review of Systems  Constitutional: Negative for fever.  HENT: Negative for sore throat.   Eyes: Negative for visual disturbance.  Respiratory: Negative for cough and shortness of breath.   Cardiovascular: Negative for chest pain.  Gastrointestinal: Positive for abdominal pain, nausea and vomiting. Negative for constipation, diarrhea, hematemesis, hematochezia and melena.  Genitourinary: Negative for dysuria and hematuria.  Musculoskeletal: Negative for neck pain.  Skin: Negative for rash.  Neurological: Negative for headaches.     Physical Exam Updated Vital Signs BP 133/79 (BP Location: Right Arm)   Pulse 68  Temp 98.5 F (36.9 C) (Oral)   Resp 17   SpO2 100%   Physical Exam Vitals signs and nursing note reviewed.  Constitutional:      Appearance: He is well-developed.  HENT:     Head: Normocephalic and atraumatic.  Eyes:     Conjunctiva/sclera: Conjunctivae normal.  Neck:     Musculoskeletal: Neck supple.  Cardiovascular:     Rate and Rhythm: Normal rate and regular rhythm.     Heart sounds: No murmur.  Pulmonary:     Effort: Pulmonary effort is normal. No respiratory distress.     Breath sounds: Normal breath sounds.  Abdominal:      Palpations: Abdomen is soft.     Tenderness: There is no abdominal tenderness. There is no guarding or rebound.     Hernia: A hernia is present. Hernia is present in the umbilical area (soft).  Musculoskeletal: Normal range of motion.     Right lower leg: No edema.     Left lower leg: No edema.  Skin:    General: Skin is warm and dry.     Capillary Refill: Capillary refill takes less than 2 seconds.  Neurological:     General: No focal deficit present.     Mental Status: He is alert.     Gait: Gait normal.      ED Treatments / Results  Labs (all labs ordered are listed, but only abnormal results are displayed) Labs Reviewed  COMPREHENSIVE METABOLIC PANEL - Abnormal; Notable for the following components:      Result Value   CO2 21 (*)    All other components within normal limits  CBC - Abnormal; Notable for the following components:   WBC 16.4 (*)    All other components within normal limits  CBG MONITORING, ED - Abnormal; Notable for the following components:   Glucose-Capillary 116 (*)    All other components within normal limits  LIPASE, BLOOD    EKG    Radiology No results found.  Procedures Procedures (including critical care time)  Medications Ordered in ED Medications  famotidine (PEPCID) IVPB 20 mg premix (0 mg Intravenous Stopped 09/21/18 2057)  sodium chloride 0.9 % bolus 1,000 mL (0 mLs Intravenous Stopped 09/21/18 2058)     Initial Impression / Assessment and Plan / ED Course  I have reviewed the triage vital signs and the nursing notes.  Pertinent labs & imaging results that were available during my care of the patient were reviewed by me and considered in my medical decision making (see chart for details).  Clinical Course as of Sep 22 831  Sun Sep 21, 2018  2023 Patient's abdominal pain improved.  His infection cell count is slightly elevated 16 and bicarb slightly low at 21 but his LFTs are normal.  We will send him home on some Pepcid and he  understands to abstain from alcohol until he is feeling better.   [MB]    Clinical Course User Index [MB] Terrilee FilesButler, Avilyn Virtue C, MD       Final Clinical Impressions(s) / ED Diagnoses   Final diagnoses:  Nausea and vomiting, intractability of vomiting not specified, unspecified vomiting type  Epigastric abdominal pain    ED Discharge Orders         Ordered    famotidine (PEPCID) 20 MG tablet  Daily     09/21/18 2024           Terrilee FilesButler, Treanna Dumler C, MD 09/22/18 [phone]

## 2018-09-21 NOTE — ED Notes (Signed)
Pt given sprite for fluid challenge. RN made aware.

## 2018-09-21 NOTE — Discharge Instructions (Signed)
You were seen in the emergency department for abdominal pain and vomiting.

## 2018-09-21 NOTE — ED Notes (Signed)
Pt was able to tolerate sprite and saltine crackers.  PO Challenge complete

## 2019-04-29 ENCOUNTER — Other Ambulatory Visit: Payer: Self-pay

## 2019-04-29 ENCOUNTER — Emergency Department (HOSPITAL_COMMUNITY)
Admission: EM | Admit: 2019-04-29 | Discharge: 2019-04-29 | Disposition: A | Discharge status: Home / Self Care | Payer: Self-pay | Attending: Emergency Medicine | Admitting: Emergency Medicine

## 2019-04-29 ENCOUNTER — Encounter (HOSPITAL_COMMUNITY): Payer: Self-pay | Admitting: Emergency Medicine

## 2019-04-29 DIAGNOSIS — Z7982 Long term (current) use of aspirin: Secondary | ICD-10-CM | POA: Insufficient documentation

## 2019-04-29 DIAGNOSIS — F17210 Nicotine dependence, cigarettes, uncomplicated: Secondary | ICD-10-CM | POA: Insufficient documentation

## 2019-04-29 DIAGNOSIS — K0889 Other specified disorders of teeth and supporting structures: Secondary | ICD-10-CM | POA: Insufficient documentation

## 2019-04-29 MED ORDER — NAPROXEN 500 MG PO TABS
500.0000 mg | ORAL_TABLET | Freq: Once | ORAL | Status: AC
  Administered 2019-04-29: 500 mg via ORAL
  Filled 2019-04-29: qty 1

## 2019-04-29 MED ORDER — NAPROXEN 500 MG PO TABS: 500.0000 mg | ORAL_TABLET | Freq: Two times a day (BID) | ORAL | 0 refills | Status: DC

## 2019-04-29 MED ORDER — AMOXICILLIN-POT CLAVULANATE 875-125 MG PO TABS: 1.0000 | ORAL_TABLET | Freq: Two times a day (BID) | ORAL | 0 refills | Status: DC

## 2019-04-29 NOTE — ED Triage Notes (Signed)
Patient complaining of tooth pain and abscess on the right side. Patient states he can not afford to get the work that needs to be done.

## 2019-04-29 NOTE — Discharge Instructions (Signed)
Call one of the dentists offices provided to schedule an appointment for re-evaluation and further management within the next 48 hours.  ° °I have prescribed you Augmentin which is an antibiotic to treat the infection and Naproxen which is an anti-inflammatory medicine to treat the pain.  ° °Please take all of your antibiotics until finished. You may develop abdominal discomfort or diarrhea from the antibiotic.  You may help offset this with probiotics which you can buy at the store (ask your pharmacist if unable to find) or get probiotics in the form of eating yogurt. Do not eat or take the probiotics until 2 hours after your antibiotic. If you are unable to tolerate these side effects follow-up with your primary care provider or return to the emergency department.  ° °If you begin to experience any blistering, rashes, swelling, or difficulty breathing seek medical care for evaluation of potentially more serious side effects.  ° °Be sure to eat something when taking the Naproxen as it can cause stomach upset and at worst stomach bleeding. Do not take additional non steroidal anti-inflammatory medicines such as Ibuprofen, Aleve, Advil, Mobic, Diclofenac, or goodie powder while taking Naproxen. You may supplement with Tylenol.  ° °We have prescribed you new medication(s) today. Discuss the medications prescribed today with your pharmacist as they can have adverse effects and interactions with your other medicines including over the counter and prescribed medications. Seek medical evaluation if you start to experience new or abnormal symptoms after taking one of these medicines, seek care immediately if you start to experience difficulty breathing, feeling of your throat closing, facial swelling, or rash as these could be indications of a more serious allergic reaction ° °If you start to experience and new or worsening symptoms return to the emergency department. If you start to experience fever, chills, neck  stiffness/pain, or inability to move your neck or open your mouth come back to the emergency department immediately.  ° °

## 2019-04-29 NOTE — ED Provider Notes (Signed)
[facility] DEPT Provider Note   CSN: [id number] Arrival date & time: 04/29/19  2055     History Chief Complaint  Patient presents with  . Dental Pain    Gregory Christian is a 44 y.o. male with a hx of tobacco abuse who presents to the ED with complaints of dental pain which has been waxing and waning for the past 2 weeks, worsened over the past few days.  Patient states that he is having pain to the right lower posterior molar, it is constant, severe, no alleviating or aggravating factors.  He states that a friend gave him some leftover penicillin which he has been taking a 1/2 tablet of per day for the past few days.  He tried to see an Chief Financial Officer but it was too expensive.  Denies fever, chills, intraoral drainage, neck stiffness, dysphagia, pain/swelling beneath the tongue, or change in voice.  HPI     History reviewed. No pertinent past medical history.  There are no problems to display for this patient.   History reviewed. No pertinent surgical history.     History reviewed. No pertinent family history.  Social History   Tobacco Use  . Smoking status: Current Every Day Smoker    Packs/day: 1.00    Types: Cigarettes  . Smokeless tobacco: Never Used  Substance Use Topics  . Alcohol use: Yes    Comment: heavy etoh every weekend x 2 years  . Drug use: No    Home Medications Prior to Admission medications   Medication Sig Start Date End Date Taking? Authorizing Provider  amoxicillin (AMOXIL) 500 MG capsule Take 1 capsule (500 mg total) by mouth 3 (three) times daily. Patient not taking: Reported on 09/21/2018 03/13/15   Pisciotta, Charna Elizabeth  aspirin 325 MG EC tablet Take 325 mg by mouth daily.    [provider]  famotidine (PEPCID) 20 MG tablet Take 1 tablet (20 mg total) by mouth daily. 09/21/18   Hayden Rasmussen, MD  naproxen (NAPROSYN) 500 MG tablet Take 1 tablet (500 mg total) by mouth 2 (two) times daily. Patient not  taking: Reported on 09/21/2018 11/17/15   Frederica Kuster, PA-C  orphenadrine (NORFLEX) 100 MG tablet Take 1 tablet (100 mg total) by mouth 2 (two) times daily as needed for muscle spasms. Patient not taking: Reported on 09/21/2018 11/17/15   Frederica Kuster, PA-C  traMADol (ULTRAM) 50 MG tablet Take 1 tablet (50 mg total) by mouth every 6 (six) hours as needed. Patient not taking: Reported on 09/21/2018 03/13/15   Pisciotta, Elmyra Ricks, PA-C    Allergies    Patient has no known allergies.  Review of Systems   Review of Systems  Constitutional: Negative for chills and fever.  HENT: Positive for dental problem. Negative for sore throat, trouble swallowing and voice change.   Respiratory: Negative for shortness of breath.   Cardiovascular: Negative for chest pain.  Gastrointestinal: Negative for abdominal pain, nausea and vomiting.  Musculoskeletal: Negative for neck stiffness.    Physical Exam Updated Vital Signs BP (!) 154/95 (BP Location: Left Arm)   Pulse 76   Temp 97.7 F (36.5 C) (Oral)   Resp 16   Ht 5\' 7"  (1.702 m)   Wt 77.1 kg   SpO2 99%   BMI 26.63 kg/m   Physical Exam Vitals and nursing note reviewed.  Constitutional:      General: He is not in acute distress.    Appearance: He is well-developed. He is  not toxic-appearing.  HENT:     Head: Normocephalic and atraumatic.     Right Ear: No drainage. Tympanic membrane is not perforated, erythematous, retracted or bulging.     Left Ear: No drainage. Tympanic membrane is not perforated, erythematous, retracted or bulging.     Ears:     Comments: No mastoid erythema, swelling, or tenderness.    Nose: Nose normal.     Mouth/Throat:     Pharynx: Uvula midline. No oropharyngeal exudate or posterior oropharyngeal erythema.      Comments: Poor dentition throughout. Posterior oropharynx is symmetric appearing. Patient tolerating own secretions without difficulty. No trismus. No drooling. No hot potato voice. No swelling beneath  the tongue, submandibular compartment is soft.  Eyes:     General:        Right eye: No discharge.        Left eye: No discharge.     Conjunctiva/sclera: Conjunctivae normal.  Cardiovascular:     Rate and Rhythm: Normal rate and regular rhythm.  Pulmonary:     Effort: Pulmonary effort is normal.     Breath sounds: Normal breath sounds.  Musculoskeletal:     Cervical back: Normal range of motion and neck supple. No edema, erythema, rigidity or crepitus. No pain with movement.  Lymphadenopathy:     Cervical: No cervical adenopathy.  Neurological:     Mental Status: He is alert.  Psychiatric:        Behavior: Behavior normal.        Thought Content: Thought content normal.    ED Results / Procedures / Treatments   Labs (all labs ordered are listed, but only abnormal results are displayed) Labs Reviewed - No data to display  EKG None  Radiology No results found.  Procedures Procedures (including critical care time)  Medications Ordered in ED Medications - No data to display  ED Course  I have reviewed the triage vital signs and the nursing notes.  Pertinent labs & imaging results that were available during my care of the patient were reviewed by me and considered in my medical decision making (see chart for details).    MDM Rules/Calculators/A&P                      Patient presents with dental pain. Patient is nontoxic appearing, vitals without significant abnormality, BP elevated- doubt HTN emergency. No gross abscess.  Exam unconcerning for Ludwig's angina or deep space infection.  Will treat with Augmentin and Naproxen- last creatinine WNL. Educated on not taking medications that were not prescribed to him. Urged patient to follow-up with dentist, dental resources were provided.  Discussed treatment plan and need for follow up as well as return precautions. Provided opportunity for questions, patient confirmed understanding and is agreeable to plan.  Final Clinical  Impression(s) / ED Diagnoses Final diagnoses:  Pain, dental    Rx / DC Orders ED Discharge Orders         Ordered    naproxen (NAPROSYN) 500 MG tablet  2 times daily     04/29/19 2239    amoxicillin-clavulanate (AUGMENTIN) 875-125 MG tablet  Every 12 hours     04/29/19 2239           Cherly Anderson, PA-C 04/29/19 2240    Milagros Loll, MD 04/30/19 1252

## 2019-07-20 ENCOUNTER — Encounter (HOSPITAL_COMMUNITY): Payer: Self-pay

## 2019-07-20 ENCOUNTER — Other Ambulatory Visit: Payer: Self-pay

## 2019-07-20 ENCOUNTER — Emergency Department (HOSPITAL_COMMUNITY)
Admission: EM | Admit: 2019-07-20 | Discharge: 2019-07-20 | Disposition: A | Discharge status: Home / Self Care | Payer: Self-pay | Attending: Emergency Medicine | Admitting: Emergency Medicine

## 2019-07-20 DIAGNOSIS — F17210 Nicotine dependence, cigarettes, uncomplicated: Secondary | ICD-10-CM | POA: Insufficient documentation

## 2019-07-20 DIAGNOSIS — K0889 Other specified disorders of teeth and supporting structures: Secondary | ICD-10-CM | POA: Insufficient documentation

## 2019-07-20 HISTORY — DX: Cardiac murmur, unspecified: R01.1

## 2019-07-20 MED ORDER — NAPROXEN 500 MG PO TABS: 500.0000 mg | ORAL_TABLET | Freq: Two times a day (BID) | ORAL | 0 refills | Status: AC

## 2019-07-20 MED ORDER — CLINDAMYCIN HCL 150 MG PO CAPS: 150.0000 mg | ORAL_CAPSULE | Freq: Four times a day (QID) | ORAL | 0 refills | Status: AC

## 2019-07-20 MED ORDER — CLINDAMYCIN HCL 300 MG PO CAPS
300.0000 mg | ORAL_CAPSULE | Freq: Once | ORAL | Status: AC
  Administered 2019-07-20: 300 mg via ORAL
  Filled 2019-07-20: qty 1

## 2019-07-20 MED ORDER — NAPROXEN 500 MG PO TABS: 500.0000 mg | ORAL_TABLET | Freq: Two times a day (BID) | ORAL | 0 refills | Status: DC

## 2019-07-20 MED ORDER — NAPROXEN 500 MG PO TABS
500.0000 mg | ORAL_TABLET | Freq: Once | ORAL | Status: AC
  Administered 2019-07-20: 19:00:00 500 mg via ORAL
  Filled 2019-07-20: qty 1

## 2019-07-20 NOTE — ED Notes (Signed)
An After Visit Summary was printed and given to the patient. Discharge instructions given and no further questions at this time.  

## 2019-07-20 NOTE — ED Provider Notes (Signed)
[facility] DEPT Provider Note   CSN: [id number] Arrival date & time: 07/20/19  1502     History Chief Complaint  Patient presents with  . Dental Pain    Gregory Christian is a 44 y.o. male.  44 year old gentleman presenting to the emergency department for dental pain.  Patient reports for the last several days he has had pain in his lower teeth.  Reports he has a history of dental abscesses and poor dentition.  Has not made an appointment with the dentist yet.  Reports that he got some antibiotics from a friend that he has been taking since Saturday.  Denies any trouble breathing or swallowing, fever, chills.        Past Medical History:  Diagnosis Date  . Heart murmur     There are no problems to display for this patient.   History reviewed. No pertinent surgical history.     Family History  Problem Relation Age of Onset  . Cancer Mother   . Heart attack Father     Social History   Tobacco Use  . Smoking status: Current Every Day Smoker    Packs/day: 1.00    Types: Cigarettes  . Smokeless tobacco: Never Used  Substance Use Topics  . Alcohol use: Yes    Comment: heavy etoh every weekend x 2 years  . Drug use: No    Home Medications Prior to Admission medications   Medication Sig Start Date End Date Taking? Authorizing Provider  amoxicillin-clavulanate (AUGMENTIN) 875-125 MG tablet Take 1 tablet by mouth every 12 (twelve) hours. 04/29/19   Petrucelli, Glynda Jaeger, PA-C  aspirin 325 MG EC tablet Take 325 mg by mouth daily.    [provider]  clindamycin (CLEOCIN) 150 MG capsule Take 1 capsule (150 mg total) by mouth every 6 (six) hours for 7 days. 07/20/19 07/27/19  Alveria Apley, PA-C  famotidine (PEPCID) 20 MG tablet Take 1 tablet (20 mg total) by mouth daily. 09/21/18   Hayden Rasmussen, MD  naproxen (NAPROSYN) 500 MG tablet Take 1 tablet (500 mg total) by mouth 2 (two) times daily for 10 days. 07/20/19 07/30/19  Alveria Apley, PA-C    Allergies    Patient has no known allergies.  Review of Systems   Review of Systems  Constitutional: Negative for chills and fever.  HENT: Positive for dental problem and facial swelling. Negative for sore throat and trouble swallowing.   Respiratory: Negative for cough and shortness of breath.   Cardiovascular: Negative for chest pain.  Gastrointestinal: Negative for abdominal pain and rectal pain.  Musculoskeletal: Negative for back pain.  Skin: Negative for rash.  Neurological: Negative for dizziness and light-headedness.    Physical Exam Updated Vital Signs BP (!) 141/86 (BP Location: Right Arm)   Pulse 61   Temp 98.3 F (36.8 C) (Oral)   Resp 16   Ht 5\' 7"  (1.702 m)   Wt 77.1 kg   SpO2 99%   BMI 26.63 kg/m   Physical Exam Vitals and nursing note reviewed.  Constitutional:      Appearance: Normal appearance.  HENT:     Head: Normocephalic.     Mouth/Throat:     Comments: Poor dentition throughout with several dental caries and cracked teeth.  On the right lower jaw there is diffuse swelling and erythema of the gums with no particular abscess. Eyes:     Conjunctiva/sclera: Conjunctivae normal.  Cardiovascular:     Rate and Rhythm: Normal  rate and regular rhythm.  Pulmonary:     Effort: Pulmonary effort is normal.  Lymphadenopathy:     Cervical: No cervical adenopathy.  Skin:    General: Skin is dry.  Neurological:     Mental Status: He is alert.  Psychiatric:        Mood and Affect: Mood normal.     ED Results / Procedures / Treatments   Labs (all labs ordered are listed, but only abnormal results are displayed) Labs Reviewed - No data to display  EKG None  Radiology No results found.  Procedures Procedures (including critical care time)  Medications Ordered in ED Medications  clindamycin (CLEOCIN) capsule 300 mg (300 mg Oral Given 07/20/19 1840)  naproxen (NAPROSYN) tablet 500 mg (500 mg Oral Given 07/20/19 1840)    ED  Course  I have reviewed the triage vital signs and the nursing notes.  Pertinent labs & imaging results that were available during my care of the patient were reviewed by me and considered in my medical decision making (see chart for details).  Clinical Course as of Jul 19 1957  Mon Jul 20, 2019  1958 Patient with dental pain and dental swelling for the last couple of days.  Appears to have gingivitis as well as some tooth infections but no obvious abscess.  No concern for Ludwick's angina at this time.  Treated with antibiotics and anti-inflammatory medication advised to follow-up with dental.  Advised on strict return precautions.   [KM]    Clinical Course User Index [KM] Jeral Pinch   MDM Rules/Calculators/A&P                      Based on review of vitals, medical screening exam, lab work and/or imaging, there does not appear to be an acute, emergent etiology for the patient's symptoms. Counseled pt on good return precautions and encouraged both PCP and ED follow-up as needed.  Prior to discharge, I also discussed incidental imaging findings with patient in detail and advised appropriate, recommended follow-up in detail.  Clinical Impression: 1. Pain, dental     Disposition: Discharge  Prior to providing a prescription for a controlled substance, I independently reviewed the patient's recent prescription history on the [place] Controlled Substance Reporting System. The patient had no recent or regular prescriptions and was deemed appropriate for a brief, less than 3 day prescription of narcotic for acute analgesia.  This note was prepared with assistance of Conservation officer, historic buildings. Occasional wrong-word or sound-a-like substitutions may have occurred due to the inherent limitations of voice recognition software.  Final Clinical Impression(s) / ED Diagnoses Final diagnoses:  Pain, dental    Rx / DC Orders ED Discharge Orders         Ordered     clindamycin (CLEOCIN) 150 MG capsule  Every 6 hours     07/20/19 1825    naproxen (NAPROSYN) 500 MG tablet  2 times daily,   Status:  Discontinued     07/20/19 1825    naproxen (NAPROSYN) 500 MG tablet  2 times daily     07/20/19 1904           Jeral Pinch 07/20/19 Onalee Hua, MD 07/21/19 1253

## 2019-07-20 NOTE — Discharge Instructions (Addendum)
Thank you for allowing me to care for you today. Please return to the emergency department if you have new or worsening symptoms. Take your medications as instructed.  ° °

## 2019-07-20 NOTE — ED Triage Notes (Addendum)
Patient reports that he has right lower dental pain. Patient states he has been taking Amoxicillin, but does not seem to be working. Patient states the pain radiates into his right jaw and right ear.

## 2020-01-26 ENCOUNTER — Other Ambulatory Visit: Payer: Self-pay

## 2020-01-26 ENCOUNTER — Ambulatory Visit (HOSPITAL_COMMUNITY)
Admission: EM | Admit: 2020-01-26 | Discharge: 2020-01-26 | Disposition: A | Discharge status: Home / Self Care | Payer: Self-pay | Attending: Family Medicine | Admitting: Family Medicine

## 2020-01-26 ENCOUNTER — Encounter (HOSPITAL_COMMUNITY): Payer: Self-pay | Admitting: Emergency Medicine

## 2020-01-26 DIAGNOSIS — K047 Periapical abscess without sinus: Secondary | ICD-10-CM

## 2020-01-26 MED ORDER — HYDROCODONE-ACETAMINOPHEN 5-325 MG PO TABS: 1.0000 | ORAL_TABLET | Freq: Four times a day (QID) | ORAL | 0 refills | Status: AC | PRN

## 2020-01-26 MED ORDER — AMOXICILLIN-POT CLAVULANATE 875-125 MG PO TABS: 1.0000 | ORAL_TABLET | Freq: Two times a day (BID) | ORAL | 0 refills | Status: AC

## 2020-01-26 MED ORDER — IBUPROFEN 800 MG PO TABS: 800.0000 mg | ORAL_TABLET | Freq: Three times a day (TID) | ORAL | 0 refills | Status: AC

## 2020-01-26 NOTE — ED Triage Notes (Signed)
Patient c/o RT side mouth pain x 3 days ago.   Patient stated he had a previous "tooth infection". Patient stated this has been "of and on " for two years. Patient had scheduled dentist appointment for tooth removal but hasn't been able to follow up since COVID pandemic.   Patient stated pain had increased over time.

## 2020-01-26 NOTE — Discharge Instructions (Addendum)
Medication as prescribed.  Follow-up with oral surgeon as planned

## 2020-01-26 NOTE — ED Provider Notes (Signed)
[facility]    CSN: [id number] Arrival date & time: 01/26/20  2202      History   Chief Complaint No chief complaint on file.   HPI Gregory Christian is a 44 y.o. male.   Patient is a 44 year old male presents today for dental pain, abscess.  This is been ongoing issue for the past couple years.  This episode started about 3 days ago.  Abscess with drainage to the right lower molar area.  Multiple dental caries and missing teeth.  Had appointment scheduled with oral surgeon but was delayed due to Covid.  Took 1 Percocet yesterday without much relief did not sleep last night.  No fevers, chills or trismus     Past Medical History:  Diagnosis Date  . Heart murmur     There are no problems to display for this patient.   History reviewed. No pertinent surgical history.     Home Medications    Prior to Admission medications   Medication Sig Start Date End Date Taking? Authorizing Provider  amoxicillin-clavulanate (AUGMENTIN) 875-125 MG tablet Take 1 tablet by mouth every 12 (twelve) hours for 10 days. 01/26/20 02/05/20  Dahlia Byes A, NP  aspirin 325 MG EC tablet Take 325 mg by mouth daily.    [provider]  famotidine (PEPCID) 20 MG tablet Take 1 tablet (20 mg total) by mouth daily. 09/21/18   Terrilee Files, MD  HYDROcodone-acetaminophen (NORCO/VICODIN) 5-325 MG tablet Take 1-2 tablets by mouth every 6 (six) hours as needed. 01/26/20   Dahlia Byes A, NP  ibuprofen (ADVIL) 800 MG tablet Take 1 tablet (800 mg total) by mouth 3 (three) times daily. 01/26/20   Janace Aris, NP    Family History Family History  Problem Relation Age of Onset  . Cancer Mother   . Heart attack Father     Social History Social History   Tobacco Use  . Smoking status: Current Every Day Smoker    Packs/day: 1.00    Types: Cigarettes  . Smokeless tobacco: Never Used  Vaping Use  . Vaping Use: Never used  Substance Use Topics  . Alcohol use: Yes    Comment:  heavy etoh every weekend x 2 years  . Drug use: No     Allergies   Patient has no known allergies.   Review of Systems Review of Systems   Physical Exam Triage Vital Signs ED Triage Vitals  Enc Vitals Group     BP 01/26/20 0823 (!) 146/84     Pulse Rate 01/26/20 0823 (!) 54     Resp 01/26/20 0823 16     Temp 01/26/20 0823 98.2 F (36.8 C)     Temp Source 01/26/20 0823 Oral     SpO2 01/26/20 0823 96 %     Weight 01/26/20 0821 169 lb 15.6 oz (77.1 kg)     Height 01/26/20 0821 5\' 7"  (1.702 m)     Head Circumference --      Peak Flow --      Pain Score 01/26/20 0821 8     Pain Loc --      Pain Edu? --      Excl. in GC? --    No data found.  Updated Vital Signs BP (!) 146/84 (BP Location: Left Arm)   Pulse (!) 54   Temp 98.2 F (36.8 C) (Oral)   Resp 16   Ht 5\' 7"  (1.702 m)   Wt 169 lb 15.6 oz (77.1  kg)   SpO2 96%   BMI 26.62 kg/m   Visual Acuity Right Eye Distance:   Left Eye Distance:   Bilateral Distance:    Right Eye Near:   Left Eye Near:    Bilateral Near:     Physical Exam Vitals and nursing note reviewed.  Constitutional:      Appearance: Normal appearance.  HENT:     Head: Normocephalic and atraumatic.     Nose: Nose normal.     Mouth/Throat:     Dentition: Dental tenderness, gingival swelling and dental caries present.   Eyes:     Conjunctiva/sclera: Conjunctivae normal.  Pulmonary:     Effort: Pulmonary effort is normal.  Musculoskeletal:        General: Normal range of motion.     Cervical back: Normal range of motion.  Skin:    General: Skin is warm and dry.  Neurological:     Mental Status: He is alert.  Psychiatric:        Mood and Affect: Mood normal.      UC Treatments / Results  Labs (all labs ordered are listed, but only abnormal results are displayed) Labs Reviewed - No data to display  EKG   Radiology No results found.  Procedures Procedures (including critical care time)  Medications Ordered in  UC Medications - No data to display  Initial Impression / Assessment and Plan / UC Course  I have reviewed the triage vital signs and the nursing notes.  Pertinent labs & imaging results that were available during my care of the patient were reviewed by me and considered in my medical decision making (see chart for details).     Dental abscess Treating with Augmentin Ibuprofen for mild to moderate pain and hydrocodone for severe pain.  See the oral surgeon as planned.  Final Clinical Impressions(s) / UC Diagnoses   Final diagnoses:  Dental abscess     Discharge Instructions     Medication as prescribed.  Follow-up with oral surgeon as planned    ED Prescriptions    Medication Sig Dispense Auth. Provider   amoxicillin-clavulanate (AUGMENTIN) 875-125 MG tablet Take 1 tablet by mouth every 12 (twelve) hours for 10 days. 20 tablet Shantil Vallejo A, NP   ibuprofen (ADVIL) 800 MG tablet Take 1 tablet (800 mg total) by mouth 3 (three) times daily. 21 tablet Presly Steinruck A, NP   HYDROcodone-acetaminophen (NORCO/VICODIN) 5-325 MG tablet Take 1-2 tablets by mouth every 6 (six) hours as needed. 6 tablet Michaele Amundson A, NP     I have reviewed the PDMP during this encounter.   Janace Aris, NP 01/26/20 [phone]

## 2021-02-01 ENCOUNTER — Emergency Department (HOSPITAL_COMMUNITY): Payer: Self-pay

## 2021-02-01 ENCOUNTER — Emergency Department (HOSPITAL_COMMUNITY)
Admission: EM | Admit: 2021-02-01 | Discharge: 2021-02-02 | Disposition: A | Discharge status: Home / Self Care | Payer: Self-pay | Attending: Emergency Medicine | Admitting: Emergency Medicine

## 2021-02-01 ENCOUNTER — Encounter (HOSPITAL_COMMUNITY): Payer: Self-pay | Admitting: *Deleted

## 2021-02-01 ENCOUNTER — Other Ambulatory Visit: Payer: Self-pay

## 2021-02-01 DIAGNOSIS — R079 Chest pain, unspecified: Secondary | ICD-10-CM | POA: Insufficient documentation

## 2021-02-01 DIAGNOSIS — R42 Dizziness and giddiness: Secondary | ICD-10-CM | POA: Insufficient documentation

## 2021-02-01 DIAGNOSIS — Z5321 Procedure and treatment not carried out due to patient leaving prior to being seen by health care provider: Secondary | ICD-10-CM | POA: Insufficient documentation

## 2021-02-01 LAB — CBC WITH DIFFERENTIAL/PLATELET
Abs Immature Granulocytes: 0.08 10*3/uL — ABNORMAL HIGH (ref 0.00–0.07)
Basophils Absolute: 0.1 10*3/uL (ref 0.0–0.1)
Basophils Relative: 1 %
Eosinophils Absolute: 0.2 10*3/uL (ref 0.0–0.5)
Eosinophils Relative: 1 %
HCT: 44.8 % (ref 39.0–52.0)
Hemoglobin: 15.7 g/dL (ref 13.0–17.0)
Immature Granulocytes: 0 %
Lymphocytes Relative: 31 %
Lymphs Abs: 5.7 10*3/uL — ABNORMAL HIGH (ref 0.7–4.0)
MCH: 34.1 pg — ABNORMAL HIGH (ref 26.0–34.0)
MCHC: 35 g/dL (ref 30.0–36.0)
MCV: 97.2 fL (ref 80.0–100.0)
Monocytes Absolute: 1.6 10*3/uL — ABNORMAL HIGH (ref 0.1–1.0)
Monocytes Relative: 8 %
Neutro Abs: 10.9 10*3/uL — ABNORMAL HIGH (ref 1.7–7.7)
Neutrophils Relative %: 59 %
Platelets: 256 10*3/uL (ref 150–400)
RBC: 4.61 MIL/uL (ref 4.22–5.81)
RDW: 12.1 % (ref 11.5–15.5)
WBC: 18.5 10*3/uL — ABNORMAL HIGH (ref 4.0–10.5)
nRBC: 0 % (ref 0.0–0.2)

## 2021-02-01 LAB — COMPREHENSIVE METABOLIC PANEL
ALT: 12 U/L (ref 0–44)
AST: 24 U/L (ref 15–41)
Albumin: 3.9 g/dL (ref 3.5–5.0)
Alkaline Phosphatase: 51 U/L (ref 38–126)
Anion gap: 9 (ref 5–15)
BUN: 9 mg/dL (ref 6–20)
CO2: 24 mmol/L (ref 22–32)
Calcium: 9.5 mg/dL (ref 8.9–10.3)
Chloride: 104 mmol/L (ref 98–111)
Creatinine, Ser: 0.96 mg/dL (ref 0.61–1.24)
GFR, Estimated: >60 mL/min (ref >60)
Glucose, Bld: 94 mg/dL (ref 70–99)
Potassium: 3.7 mmol/L (ref 3.5–5.1)
Sodium: 137 mmol/L (ref 135–145)
Total Bilirubin: 0.5 mg/dL (ref 0.3–1.2)
Total Protein: 6.6 g/dL (ref 6.5–8.1)

## 2021-02-01 LAB — TROPONIN I (HIGH SENSITIVITY): Troponin I (High Sensitivity): 9 ng/L (ref <18)

## 2021-02-01 LAB — LIPASE, BLOOD: Lipase: 32 U/L (ref 11–51)

## 2021-02-01 NOTE — ED Triage Notes (Signed)
Pt states she has been having dizzy spells at home this am when he woke up.  (States had a dizzy spell last week as well).  Dizziness has continued throughout the day.  States some chest pain yesterday.   Denies any nausea or headaches.

## 2021-02-01 NOTE — ED Notes (Signed)
Pt called for room x1 with no response 

## 2021-02-01 NOTE — ED Provider Notes (Signed)
Emergency Medicine Provider Triage Evaluation Note  Gregory Christian , a 45 y.o. male  was evaluated in triage.  Pt complains of left sided chest pain for the past six days.  He reports that he is dizzy.  That started today.  He has had similar pains over the past years but lasts normally 1-2 days.  This episode is the first time he has had dizziness.  The chest pain is slightly better today.  He hasn't been evaluated for the chest pain before.  He doesn't recall what happened when he started having pain this times but thinks he may have been stressed.  He reports that he is fine during the day at work.    Review of Systems  Positive: Chest pain.  Negative: Nausea, vomiting, shortness of breath  Physical Exam  BP 139/89 (BP Location: Right Arm)   Pulse 92   Temp (!) 97.2 F (36.2 C)   Resp 18   SpO2 100%  Gen:   Awake, no distress   Resp:  Normal effort  MSK:   Moves extremities without difficulty  Other:  Normal speech.  Normal EOMS with out nystagmus,   Medical Decision Making  Medically screening exam initiated at 9:31 PM.  Appropriate orders placed.  [name] was informed that the remainder of the evaluation will be completed by another provider, this initial triage assessment does not replace that evaluation, and the importance of remaining in the ED until their evaluation is complete.  Note: Portions of this report may have been transcribed using voice recognition software. Every effort was made to ensure accuracy; however, inadvertent computerized transcription errors may be present      Norman Clay 02/01/21 2142    Terrilee Files, MD 02/02/21 1021

## 2022-06-11 ENCOUNTER — Other Ambulatory Visit: Payer: Self-pay

## 2022-06-11 ENCOUNTER — Encounter (HOSPITAL_COMMUNITY): Payer: Self-pay

## 2022-06-11 ENCOUNTER — Emergency Department (HOSPITAL_COMMUNITY)
Admission: EM | Admit: 2022-06-11 | Discharge: 2022-06-11 | Discharge status: Against Medical Advice | Payer: Self-pay | Attending: Emergency Medicine | Admitting: Emergency Medicine

## 2022-06-11 DIAGNOSIS — S30865A Insect bite (nonvenomous) of unspecified external genital organs, male, initial encounter: Secondary | ICD-10-CM | POA: Insufficient documentation

## 2022-06-11 DIAGNOSIS — Z5321 Procedure and treatment not carried out due to patient leaving prior to being seen by health care provider: Secondary | ICD-10-CM | POA: Insufficient documentation

## 2022-06-11 DIAGNOSIS — W57XXXA Bitten or stung by nonvenomous insect and other nonvenomous arthropods, initial encounter: Secondary | ICD-10-CM | POA: Insufficient documentation

## 2022-06-11 NOTE — ED Provider Triage Note (Signed)
Emergency Medicine Provider Triage Evaluation Note  Gregory Christian , a 47 y.o. male  was evaluated in triage.  Pt complains of groin swelling.  Patient reports that he had a tick bite on his groin yesterday unsure exactly how long tick had been there.  He reports that he removed the tick yesterday and believes he got everything out but is now reporting some groin redness and swelling since then.  No significant pain or tenderness with it but it is somewhat painful to the touch.  Denies any fevers, nausea, vomiting, chest pain, diarrhea..  Review of Systems  Positive: As above Negative: As above  Physical Exam  BP 137/79 (BP Location: Left Arm)   Pulse 85   Temp 98.4 F (36.9 C) (Oral)   Resp 18   Ht '5\' 7"'$  (1.702 m)   Wt 83.9 kg   SpO2 93%   BMI 28.98 kg/m  Gen:   Awake, no distress   Resp:  Normal effort  MSK:   Moves extremities without difficulty  Other:  Erythema, induration noted to the suprapubic groin space  Medical Decision Making  Medically screening exam initiated at 1:58 PM.  Appropriate orders placed.  Britt Bolognese was informed that the remainder of the evaluation will be completed by another provider, this initial triage assessment does not replace that evaluation, and the importance of remaining in the ED until their evaluation is complete.     Luvenia Heller, PA-C 06/11/22 1359

## 2022-06-11 NOTE — ED Notes (Signed)
Patient walked out saying "I've already been here for 5 hours I'm definitely not waiting any more." Patient encouraged to stay since he got a room. Patient said he was still leaving. Ariel PA notified.

## 2022-06-11 NOTE — ED Triage Notes (Signed)
Patient got bit by a tick yesterday on his pubic bone/right groin area. Today he noticed redness and swelling.

## 2022-08-14 IMAGING — CR DG CHEST 2V
2 series · 2 of 2 positions shown · non-contrast
Comparison: PA chest and rib series 11/17/2015

CLINICAL DATA: Dizziness and chest pain

EXAM:
CHEST - 2 VIEW

[chest pa]
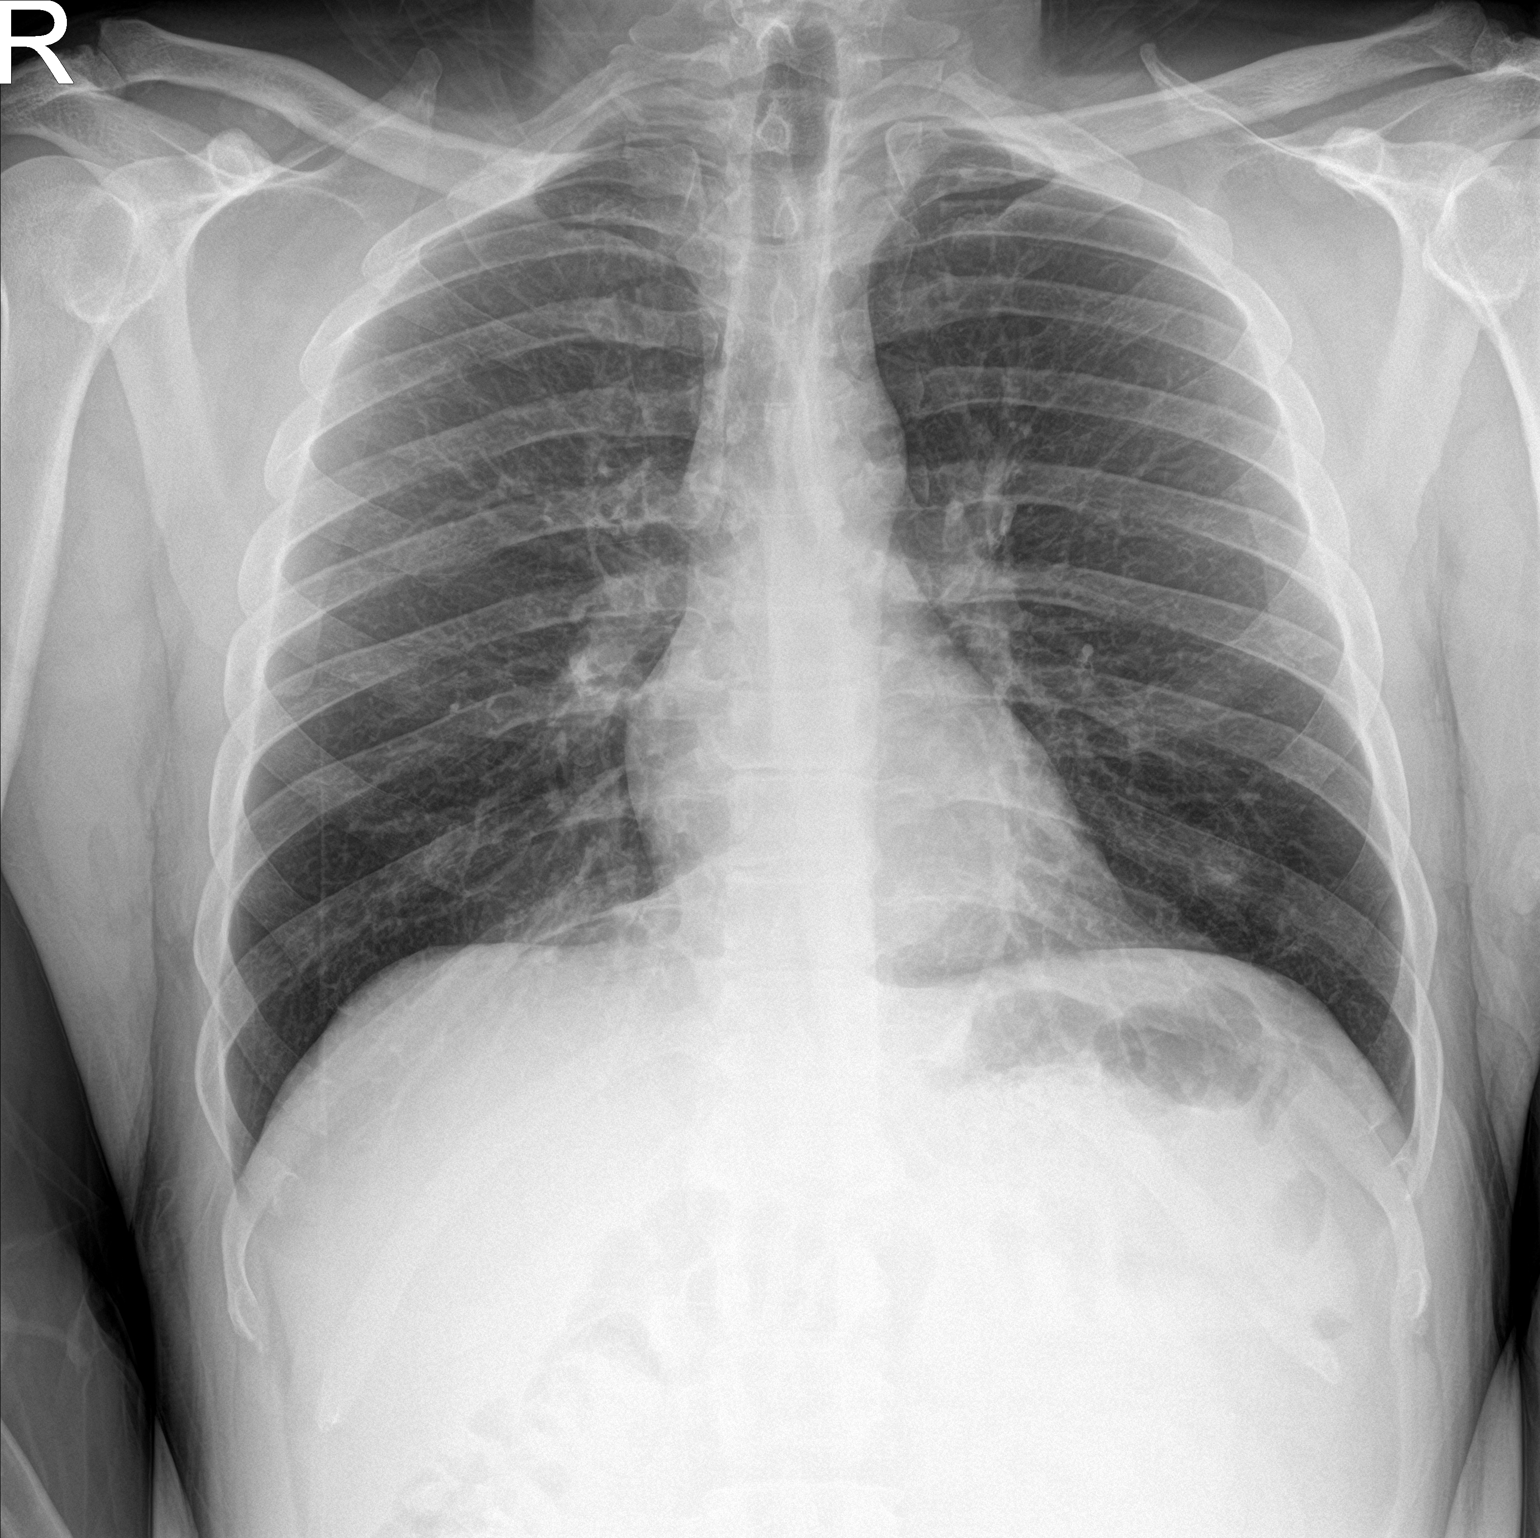

[chest lat]
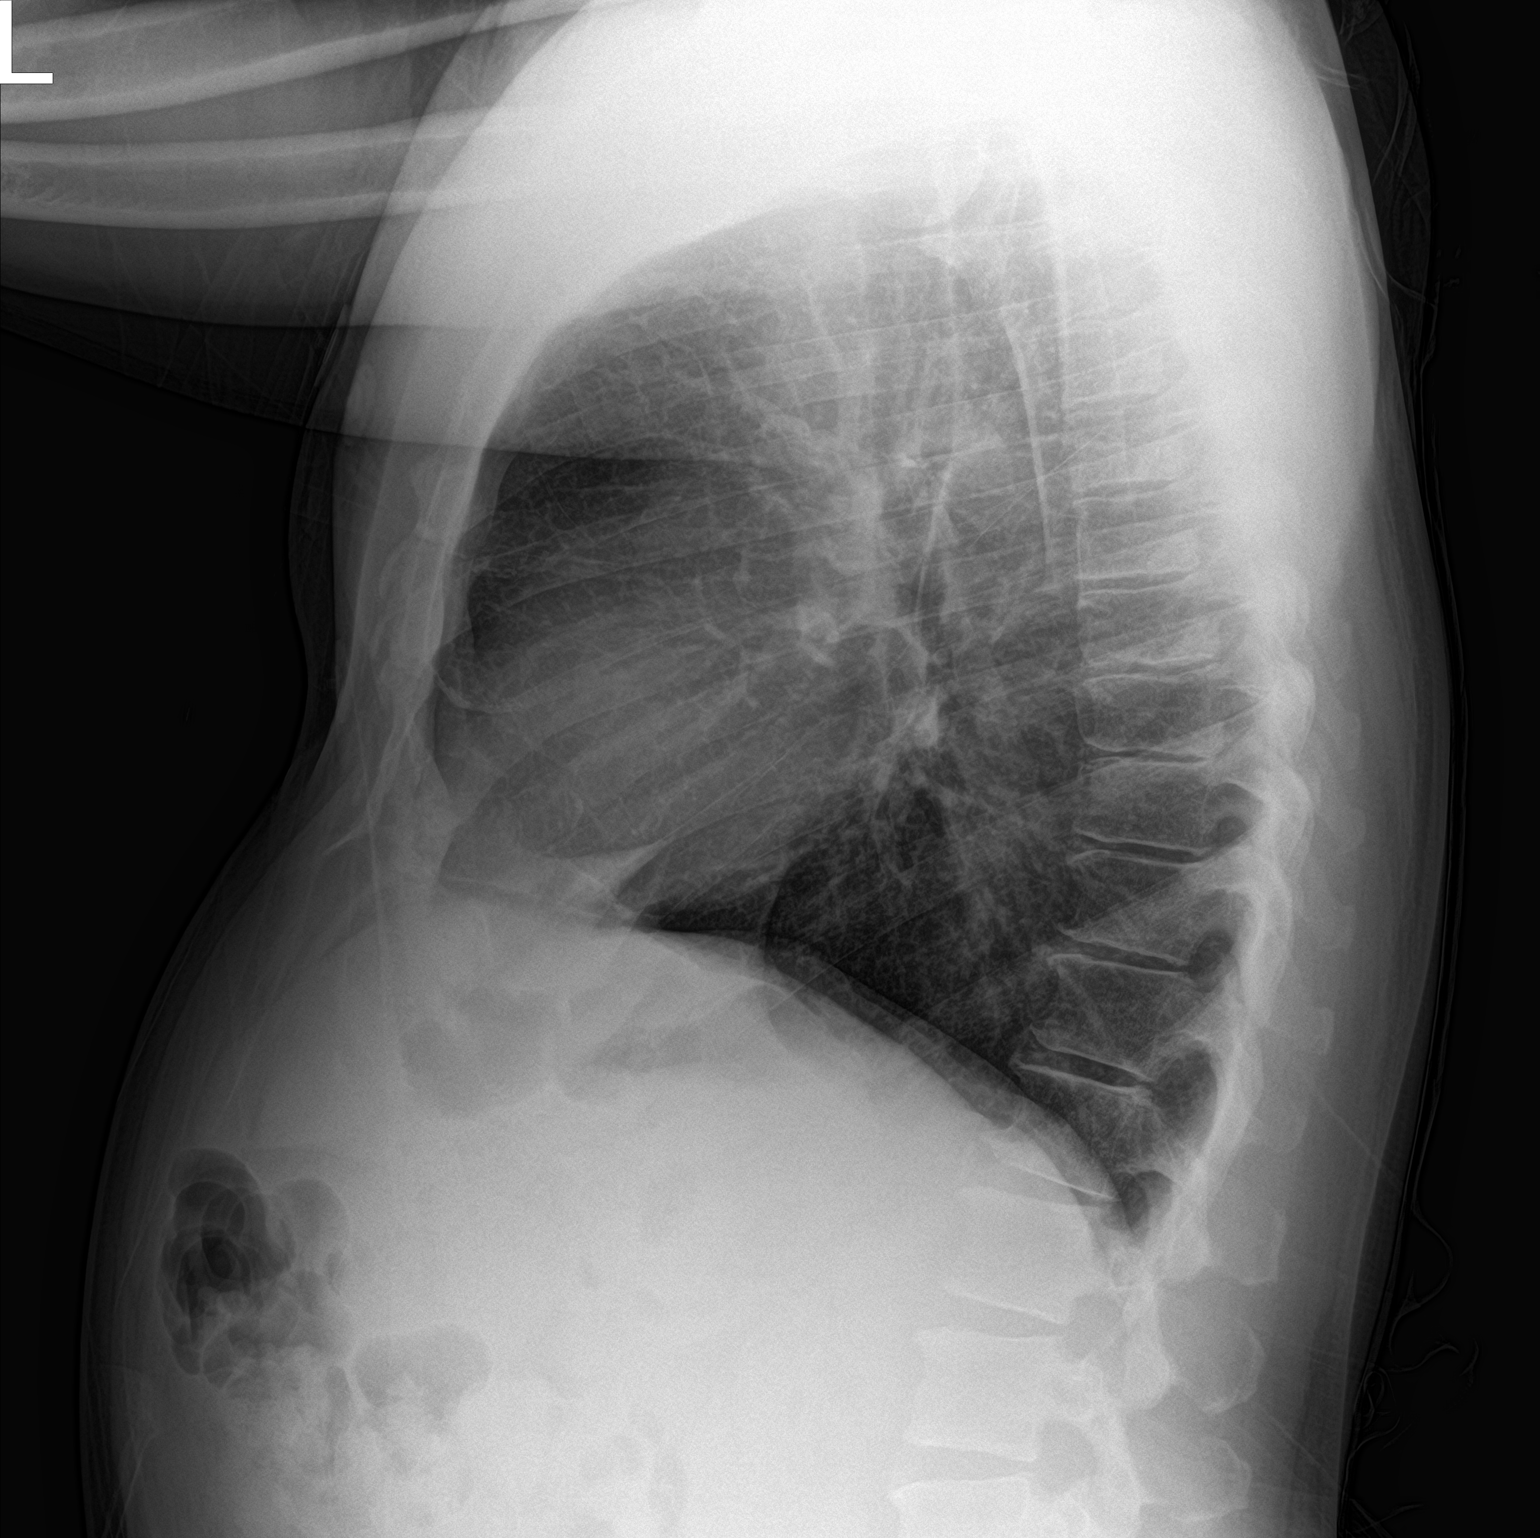

[2 of 2 positions shown; findings below may reference images not displayed]

FINDINGS: The heart size and mediastinal contours are within normal limits.
Both lungs are clear. The visualized skeletal structures are
unremarkable aside from degenerative disc disease in the lower
thoracic spine and spondylosis.
IMPRESSION: No active cardiopulmonary disease or interval changes.

## 2022-10-19 ENCOUNTER — Encounter (HOSPITAL_BASED_OUTPATIENT_CLINIC_OR_DEPARTMENT_OTHER): Payer: Self-pay | Admitting: Emergency Medicine

## 2022-10-19 ENCOUNTER — Other Ambulatory Visit: Payer: Self-pay

## 2022-10-19 ENCOUNTER — Emergency Department (HOSPITAL_BASED_OUTPATIENT_CLINIC_OR_DEPARTMENT_OTHER)
Admission: EM | Admit: 2022-10-19 | Discharge: 2022-10-19 | Disposition: A | Discharge status: Home / Self Care | Payer: Self-pay | Attending: Emergency Medicine | Admitting: Emergency Medicine

## 2022-10-19 DIAGNOSIS — K029 Dental caries, unspecified: Secondary | ICD-10-CM | POA: Insufficient documentation

## 2022-10-19 MED ORDER — AMOXICILLIN-POT CLAVULANATE 875-125 MG PO TABS: 1.0000 | ORAL_TABLET | Freq: Two times a day (BID) | ORAL | 0 refills | Status: AC

## 2022-10-19 MED ORDER — AMOXICILLIN-POT CLAVULANATE 875-125 MG PO TABS
1.0000 | ORAL_TABLET | Freq: Once | ORAL | Status: AC
  Administered 2022-10-19: 1 via ORAL

## 2022-10-19 NOTE — ED Notes (Signed)
Pt has been taking abx that were left over from past use.

## 2022-10-19 NOTE — Discharge Instructions (Addendum)
You were seen in the ER today for evaluation of your dental pain. Please take the antibiotic as prescribed and complete the entirety of the course. For pain, I recommend Tylenol 1000mg  and/or ibuprofen 600mg  every 6 hours as needed for pain. Please make sure you call your oral surgeon to schedule an appointment. If you have any concerns, new or worsening symptoms, please return to the nearest ER for re-evaluation.   Contact a dentist if: You have dental pain and you do not know why. Medicine does not help your pain. Your symptoms get worse. You have new symptoms. Get help right away if: You cannot open your mouth. You are having trouble breathing or swallowing. You have a fever. Your face, neck, or jaw is swollen. These symptoms may be an emergency. Get help right away. Call your local emergency services (911 in the [place] Do not wait to see if the symptoms will go away. Do not drive yourself to the hospital.

## 2022-10-19 NOTE — ED Triage Notes (Signed)
Pt presents to ED Pov. Pt c/o R dental pain x2d. Pt reports taking leftover antibiotics w/o relief

## 2022-10-19 NOTE — ED Provider Notes (Signed)
[place] EMERGENCY DEPARTMENT AT [facility] Provider Note   CSN: [id number] Arrival date & time: 10/19/22  1315     History Chief Complaint  Patient presents with   Dental Pain    Gregory Christian is a 47 y.o. male presents to the ER for evaluation of right lower dental pain for the past 2 days.  He reports he took some leftover antibiotics from a few years ago without much relief.  He does not have any trouble eating or drinking.  No trouble swallowing.  Denies any facial swelling as well.  Denies any radiation of his pain.  He reports he does have an oral surgeon these follow-up with [place] about before however has not made an appointment.  Has not tried any pain medication prior to arrival.   Dental Pain Associated symptoms: no congestion, no facial swelling, no fever and no neck pain        Home Medications Prior to Admission medications   Medication Sig Start Date End Date Taking? Authorizing Provider  amoxicillin-clavulanate (AUGMENTIN) 875-125 MG tablet Take 1 tablet by mouth every 12 (twelve) hours. 10/19/22  Yes Achille Rich, PA-C  aspirin 325 MG EC tablet Take 325 mg by mouth daily.    [provider]  famotidine (PEPCID) 20 MG tablet Take 1 tablet (20 mg total) by mouth daily. 09/21/18   Terrilee Files, MD  HYDROcodone-acetaminophen (NORCO/VICODIN) 5-325 MG tablet Take 1-2 tablets by mouth every 6 (six) hours as needed. 01/26/20   Dahlia Byes A, NP  ibuprofen (ADVIL) 800 MG tablet Take 1 tablet (800 mg total) by mouth 3 (three) times daily. 01/26/20   Janace Aris, NP      Allergies    Patient has no known allergies.    Review of Systems   Review of Systems  Constitutional:  Negative for chills and fever.  HENT:  Positive for dental problem. Negative for congestion, facial swelling, rhinorrhea and trouble swallowing.   Respiratory:  Negative for shortness of breath.   Cardiovascular:  Negative for chest pain.  Musculoskeletal:  Negative for neck  pain and neck stiffness.    Physical Exam Updated Vital Signs BP 128/86 (BP Location: Right Arm)   Pulse 82   Temp 98.5 F (36.9 C) (Oral)   Resp 18   SpO2 99%  Physical Exam Vitals and nursing note reviewed.  Constitutional:      General: He is not in acute distress.    Appearance: Normal appearance. He is not toxic-appearing.  HENT:     Mouth/Throat:     Mouth: Mucous membranes are moist.     Comments: Poor dentition noted throughout.  Patient has significant dental caries with erosion below the gum on the lower right.  Reports been consistent for the past 4 years.  No sublingual elevation.  No trismus, uvula midline.  Airway patent.  Moist mucous membranes.  Eyes:     General: No scleral icterus. Pulmonary:     Effort: Pulmonary effort is normal. No respiratory distress.  Skin:    General: Skin is dry.  Neurological:     General: No focal deficit present.     Mental Status: He is alert. Mental status is at baseline.  Psychiatric:        Mood and Affect: Mood normal.     ED Results / Procedures / Treatments   Labs (all labs ordered are listed, but only abnormal results are displayed) Labs Reviewed - No data to display  EKG None  Radiology No results found.  Procedures Procedures   Medications Ordered in ED Medications  amoxicillin-clavulanate (AUGMENTIN) 875-125 MG per tablet 1 tablet (has no administration in time range)    ED Course/ Medical Decision Making/ A&P                           Medical Decision Making  47 y.o. male presents to the ER today for evaluation of dental pain. Differential diagnosis includes but is not limited to dental caries, deep space infection, Ludwig's . Vital signs unremarkable. Physical exam as noted above.   The patient does not have any indication of deep space infection, . Vital signs are stable.  He does not have any facial swelling.  No trismus.  Controlling secretions.  Normal speech.  I do not appreciate any palpable  fluctuance or induration.  I do not see any drainable abscess.  I doubt any Ludwick's given he does not have any sublingual elevation.  He is controlling his secretions and not having any trouble or pain with swallowing.  Patient's been dealing with a problem off and on for the past 4 years.  He reports he does have an oral Careers adviser.  Will prescribe him some antibiotics recommended Tylenol and Profen as needed for pain.  Recommend following up with his oral surgeon.  First dose of antibiotic given here.  We discussed plan at bedside. We discussed strict return precautions and red flag symptoms. The patient verbalized their understanding and agrees to the plan. The patient is stable and being discharged home in good condition.  Portions of this report may have been transcribed using voice recognition software. Every effort was made to ensure accuracy; however, inadvertent computerized transcription errors may be present.   Final Clinical Impression(s) / ED Diagnoses Final diagnoses:  Dental caries    Rx / DC Orders ED Discharge Orders          Ordered    amoxicillin-clavulanate (AUGMENTIN) 875-125 MG tablet  Every 12 hours        10/19/22 1530              Achille Rich, [place] 10/19/22 1539    Blane Ohara, MD 10/19/22 2257

## 2022-10-19 NOTE — ED Notes (Signed)
Patient verbalizes understanding of discharge instructions. Opportunity for questioning and answers were provided. Patient discharged from ED.  °

## 2023-03-04 ENCOUNTER — Emergency Department (HOSPITAL_BASED_OUTPATIENT_CLINIC_OR_DEPARTMENT_OTHER)
Admission: EM | Admit: 2023-03-04 | Discharge: 2023-03-04 | Disposition: A | Discharge status: Home / Self Care | Payer: Self-pay | Attending: Emergency Medicine | Admitting: Emergency Medicine

## 2023-03-04 ENCOUNTER — Encounter (HOSPITAL_BASED_OUTPATIENT_CLINIC_OR_DEPARTMENT_OTHER): Payer: Self-pay

## 2023-03-04 ENCOUNTER — Other Ambulatory Visit (HOSPITAL_BASED_OUTPATIENT_CLINIC_OR_DEPARTMENT_OTHER): Payer: Self-pay

## 2023-03-04 ENCOUNTER — Other Ambulatory Visit: Payer: Self-pay

## 2023-03-04 DIAGNOSIS — J029 Acute pharyngitis, unspecified: Secondary | ICD-10-CM | POA: Insufficient documentation

## 2023-03-04 DIAGNOSIS — Z7982 Long term (current) use of aspirin: Secondary | ICD-10-CM | POA: Insufficient documentation

## 2023-03-04 LAB — GROUP A STREP BY PCR: Group A Strep by PCR: NOT DETECTED

## 2023-03-04 MED ORDER — PREDNISONE 20 MG PO TABS
40.0000 mg | ORAL_TABLET | Freq: Every day | ORAL | 0 refills | Status: AC
  Filled 2023-03-04: qty 10, 5d supply, fill #0

## 2023-03-04 MED ORDER — CLINDAMYCIN HCL 150 MG PO CAPS
450.0000 mg | ORAL_CAPSULE | Freq: Once | ORAL | Status: AC
  Administered 2023-03-04: 450 mg via ORAL
  Filled 2023-03-04: qty 3

## 2023-03-04 MED ORDER — CLINDAMYCIN HCL 150 MG PO CAPS
450.0000 mg | ORAL_CAPSULE | Freq: Three times a day (TID) | ORAL | 0 refills | Status: AC
  Filled 2023-03-04: qty 90, 10d supply, fill #0

## 2023-03-04 MED ORDER — DEXAMETHASONE 4 MG PO TABS
10.0000 mg | ORAL_TABLET | Freq: Once | ORAL | Status: AC
  Administered 2023-03-04: 10 mg via ORAL
  Filled 2023-03-04: qty 3

## 2023-03-04 NOTE — Discharge Instructions (Addendum)
As we discussed I have started you on steroids and antibiotics for throat infection.  I do not think that you have a drainable abscess at this time given your clinical findings today but that is something that may progress with your symptoms.  As we discussed if you have difficulty opening your mouth or move your neck or change in your speech or more difficulty swallowing please return for evaluation in the ED but my hope is that you can possibly have a follow-up tomorrow the next day with ear nose and throat team for reevaluation.  Take your next dose antibiotic and steroid tomorrow.

## 2023-03-04 NOTE — ED Provider Notes (Signed)
[place] EMERGENCY DEPARTMENT AT [facility] Provider Note   CSN: [id number] Arrival date & time: 03/04/23  1348     History  No chief complaint on file.   Gregory Christian is a 47 y.o. male.  The history is provided by the patient.       Home Medications Prior to Admission medications   Medication Sig Start Date End Date Taking? Authorizing Provider  clindamycin (CLEOCIN) 150 MG capsule Take 3 capsules (450 mg total) by mouth 3 (three) times daily for 10 days. 03/04/23 03/14/23 Yes Kleigh Hoelzer, DO  predniSONE (DELTASONE) 20 MG tablet Take 2 tablets (40 mg total) by mouth daily for 5 days. 03/04/23 03/09/23 Yes Berkeley Veldman, DO  amoxicillin-clavulanate (AUGMENTIN) 875-125 MG tablet Take 1 tablet by mouth every 12 (twelve) hours. 10/19/22   Achille Rich, PA-C  aspirin 325 MG EC tablet Take 325 mg by mouth daily.    [provider]  famotidine (PEPCID) 20 MG tablet Take 1 tablet (20 mg total) by mouth daily. 09/21/18   Terrilee Files, MD  HYDROcodone-acetaminophen (NORCO/VICODIN) 5-325 MG tablet Take 1-2 tablets by mouth every 6 (six) hours as needed. 01/26/20   Dahlia Byes A, NP  ibuprofen (ADVIL) 800 MG tablet Take 1 tablet (800 mg total) by mouth 3 (three) times daily. 01/26/20   Janace Aris, NP      Allergies    Patient has no known allergies.    Review of Systems   Review of Systems  Physical Exam Updated Vital Signs BP (!) 138/91 (BP Location: Right Arm)   Christian 99   Temp 98.7 F (37.1 C)   Resp 16   SpO2 100%  Physical Exam Vitals and nursing note reviewed.  Constitutional:      General: He is not in acute distress.    Appearance: He is well-developed. He is not ill-appearing.  HENT:     Head: Normocephalic and atraumatic.     Nose: Nose normal.  Eyes:     Extraocular Movements: Extraocular movements intact.     Conjunctiva/sclera: Conjunctivae normal.     Pupils: Pupils are equal, round, and reactive to light.  Cardiovascular:      Rate and Rhythm: Normal rate and regular rhythm.     Pulses: Normal pulses.     Heart sounds: Normal heart sounds. No murmur heard. Pulmonary:     Effort: Pulmonary effort is normal. No respiratory distress.     Breath sounds: Normal breath sounds.  Abdominal:     General: Abdomen is flat.     Palpations: Abdomen is soft.     Tenderness: There is no abdominal tenderness.  Musculoskeletal:        General: No swelling.     Cervical back: Normal range of motion and neck supple.  Skin:    General: Skin is warm and dry.     Capillary Refill: Capillary refill takes less than 2 seconds.  Neurological:     General: No focal deficit present.     Mental Status: He is alert.  Psychiatric:        Mood and Affect: Mood normal.     ED Results / Procedures / Treatments   Labs (all labs ordered are listed, but only abnormal results are displayed) Labs Reviewed  GROUP A STREP BY PCR    EKG None  Radiology No results found.  Procedures Procedures    Medications Ordered in ED Medications  clindamycin (CLEOCIN) capsule 450 mg (has no administration in  time range)  dexamethasone (DECADRON) tablet 10 mg (has no administration in time range)    ED Course/ Medical Decision Making/ A&P                                 Medical Decision Making Risk Prescription drug management.   Gregory Christian is here with sore throat.  Patient does have some fullness to the left side of the hard palate but uvula is midline, there is no obvious abscess to the tonsils, no trismus or drooling or any submandibular swelling, normal range of motion of the neck.  Overall this does appear to be a pharyngitis.  Vital signs are normal.  He has no fever.  Strep test was negative however we will treat with clindamycin and steroids for what I suspect is an infectious process.  Overall at this time I do not think he has a drainable abscess but given some of the inflammation and swelling in the area of the left  tonsil and left hard palate area he was given very strict return precautions including difficulty opening mouth, difficulty swallowing, major change of voice, worsening swelling to return for reevaluation.  I have given him information to follow-up with ENT to see if they can do a recheck outpatient either tomorrow or the next day.  He was given his first dose of antibiotic and steroids here.  Overall he feels comfortable with this plan which I think is reasonable.  Discharged in good condition.  Understands return precautions.  This chart was dictated using voice recognition software.  Despite best efforts to proofread,  errors can occur which can change the documentation meaning.         Final Clinical Impression(s) / ED Diagnoses Final diagnoses:  Pharyngitis, unspecified etiology    Rx / DC Orders ED Discharge Orders          Ordered    clindamycin (CLEOCIN) 150 MG capsule  3 times daily        03/04/23 1558    predniSONE (DELTASONE) 20 MG tablet  Daily        03/04/23 1558              [place], DO 03/04/23 1615

## 2023-03-04 NOTE — ED Triage Notes (Signed)
Pt c/o sore throat onset 3 days ago. Associated HA. Hx strep, states "I know I have strep."

## 2023-03-05 ENCOUNTER — Telehealth (INDEPENDENT_AMBULATORY_CARE_PROVIDER_SITE_OTHER): Payer: Self-pay

## 2023-03-05 NOTE — Telephone Encounter (Signed)
Spoke with patient and he verbalized he spoke with the doctor this morning and he verbalized based on the antibiotics he is taking if he is not better/worsens by Friday to call and schedule. I did not see a provider telephone note.
